# Patient Record
Sex: Male | Born: 1955 | Race: Black or African American | Hispanic: No | Marital: Married | Smoking: Never smoker
Health system: Southern US, Community
[De-identification: ages and names within clinical notes are randomized; demographics above are authoritative.]

## PROBLEM LIST (undated history)

## (undated) DIAGNOSIS — K219 Gastro-esophageal reflux disease without esophagitis: Secondary | ICD-10-CM

## (undated) DIAGNOSIS — M1990 Unspecified osteoarthritis, unspecified site: Secondary | ICD-10-CM

## (undated) DIAGNOSIS — I10 Essential (primary) hypertension: Secondary | ICD-10-CM

## (undated) DIAGNOSIS — M109 Gout, unspecified: Secondary | ICD-10-CM

## (undated) DIAGNOSIS — N189 Chronic kidney disease, unspecified: Secondary | ICD-10-CM

## (undated) HISTORY — DX: Essential (primary) hypertension: I10

## (undated) HISTORY — PX: REPLACEMENT TOTAL KNEE: SUR1224

## (undated) HISTORY — DX: Gout, unspecified: M10.9

## (undated) HISTORY — PX: HERNIA REPAIR: SHX51

## (undated) HISTORY — DX: Unspecified osteoarthritis, unspecified site: M19.90

---

## 1999-01-09 ENCOUNTER — Emergency Department (HOSPITAL_COMMUNITY): Admission: EM | Admit: 1999-01-09 | Discharge: 1999-01-09 | Payer: Self-pay | Admitting: Emergency Medicine

## 1999-01-09 ENCOUNTER — Encounter: Payer: Self-pay | Admitting: Emergency Medicine

## 2001-10-08 ENCOUNTER — Emergency Department (HOSPITAL_COMMUNITY): Admission: EM | Admit: 2001-10-08 | Discharge: 2001-10-09 | Payer: Self-pay | Admitting: Emergency Medicine

## 2001-10-09 ENCOUNTER — Encounter: Payer: Self-pay | Admitting: Emergency Medicine

## 2004-08-17 ENCOUNTER — Emergency Department (HOSPITAL_COMMUNITY): Admission: EM | Admit: 2004-08-17 | Discharge: 2004-08-17 | Payer: Self-pay | Admitting: Emergency Medicine

## 2004-08-21 ENCOUNTER — Emergency Department (HOSPITAL_COMMUNITY): Admission: EM | Admit: 2004-08-21 | Discharge: 2004-08-21 | Payer: Self-pay | Admitting: Emergency Medicine

## 2004-09-08 ENCOUNTER — Emergency Department (HOSPITAL_COMMUNITY): Admission: EM | Admit: 2004-09-08 | Discharge: 2004-09-09 | Payer: Self-pay | Admitting: Emergency Medicine

## 2005-08-02 ENCOUNTER — Emergency Department (HOSPITAL_COMMUNITY): Admission: EM | Admit: 2005-08-02 | Discharge: 2005-08-02 | Payer: Self-pay | Admitting: Emergency Medicine

## 2006-08-20 ENCOUNTER — Other Ambulatory Visit: Payer: Self-pay

## 2006-08-21 ENCOUNTER — Observation Stay: Payer: Self-pay | Admitting: Internal Medicine

## 2006-08-31 ENCOUNTER — Encounter (INDEPENDENT_AMBULATORY_CARE_PROVIDER_SITE_OTHER): Payer: Self-pay | Admitting: Internal Medicine

## 2006-08-31 ENCOUNTER — Ambulatory Visit (HOSPITAL_COMMUNITY): Admission: RE | Admit: 2006-08-31 | Discharge: 2006-08-31 | Payer: Self-pay | Admitting: Internal Medicine

## 2006-08-31 ENCOUNTER — Ambulatory Visit: Payer: Self-pay | Admitting: Vascular Surgery

## 2008-09-27 ENCOUNTER — Emergency Department (HOSPITAL_COMMUNITY): Admission: EM | Admit: 2008-09-27 | Discharge: 2008-09-27 | Payer: Self-pay | Admitting: Emergency Medicine

## 2008-10-28 ENCOUNTER — Encounter: Admission: RE | Admit: 2008-10-28 | Discharge: 2008-10-28 | Payer: Self-pay | Admitting: Orthopaedic Surgery

## 2009-10-16 ENCOUNTER — Emergency Department (HOSPITAL_COMMUNITY): Admission: EM | Admit: 2009-10-16 | Discharge: 2009-10-16 | Payer: Self-pay | Admitting: Family Medicine

## 2010-08-18 ENCOUNTER — Ambulatory Visit (HOSPITAL_COMMUNITY)
Admission: RE | Admit: 2010-08-18 | Discharge: 2010-08-18 | Disposition: A | Discharge status: Home / Self Care | Payer: BC Managed Care – PPO | Source: Ambulatory Visit | Attending: Orthopaedic Surgery | Admitting: Orthopaedic Surgery

## 2010-08-18 ENCOUNTER — Other Ambulatory Visit (HOSPITAL_COMMUNITY): Payer: Self-pay | Admitting: Orthopaedic Surgery

## 2010-08-18 ENCOUNTER — Encounter (HOSPITAL_COMMUNITY)
Admission: RE | Admit: 2010-08-18 | Discharge: 2010-08-18 | Disposition: A | Discharge status: Home / Self Care | Payer: BC Managed Care – PPO | Source: Ambulatory Visit | Attending: Orthopaedic Surgery | Admitting: Orthopaedic Surgery

## 2010-08-18 DIAGNOSIS — Z01811 Encounter for preprocedural respiratory examination: Secondary | ICD-10-CM | POA: Insufficient documentation

## 2010-08-18 DIAGNOSIS — M1710 Unilateral primary osteoarthritis, unspecified knee: Secondary | ICD-10-CM | POA: Insufficient documentation

## 2010-08-18 DIAGNOSIS — Z01810 Encounter for preprocedural cardiovascular examination: Secondary | ICD-10-CM | POA: Insufficient documentation

## 2010-08-18 DIAGNOSIS — I10 Essential (primary) hypertension: Secondary | ICD-10-CM | POA: Insufficient documentation

## 2010-08-18 DIAGNOSIS — Z01812 Encounter for preprocedural laboratory examination: Secondary | ICD-10-CM | POA: Insufficient documentation

## 2010-08-18 DIAGNOSIS — IMO0002 Reserved for concepts with insufficient information to code with codable children: Secondary | ICD-10-CM | POA: Insufficient documentation

## 2010-08-18 DIAGNOSIS — M1711 Unilateral primary osteoarthritis, right knee: Secondary | ICD-10-CM

## 2010-08-18 LAB — URINALYSIS, ROUTINE W REFLEX MICROSCOPIC
Bilirubin Urine: NEGATIVE
Ketones, ur: NEGATIVE mg/dL
Leukocytes, UA: NEGATIVE
Nitrite: NEGATIVE
Protein, ur: NEGATIVE mg/dL
Urobilinogen, UA: 0.2 mg/dL (ref 0.0–1.0)
pH: 6 (ref 5.0–8.0)

## 2010-08-18 LAB — COMPREHENSIVE METABOLIC PANEL
ALT: 12 U/L (ref 0–53)
AST: 17 U/L (ref 0–37)
Albumin: 3.8 g/dL (ref 3.5–5.2)
Alkaline Phosphatase: 145 U/L — ABNORMAL HIGH (ref 39–117)
BUN: 9 mg/dL (ref 6–23)
Chloride: 103 mEq/L (ref 96–112)
GFR calc Af Amer: >60 mL/min (ref >60)
Potassium: 3.9 mEq/L (ref 3.5–5.1)
Sodium: 141 mEq/L (ref 135–145)
Total Bilirubin: 0.6 mg/dL (ref 0.3–1.2)
Total Protein: 7.6 g/dL (ref 6.0–8.3)

## 2010-08-18 LAB — CBC
MCH: 27.9 pg (ref 26.0–34.0)
MCV: 78.8 fL (ref 78.0–100.0)
Platelets: 262 10*3/uL (ref 150–400)
RBC: 5.2 MIL/uL (ref 4.22–5.81)
RDW: 15.1 % (ref 11.5–15.5)
WBC: 6.5 10*3/uL (ref 4.0–10.5)

## 2010-08-18 LAB — PROTIME-INR: INR: 1.16 (ref 0.00–1.49)

## 2010-08-18 LAB — APTT: aPTT: 30 seconds (ref 24–37)

## 2010-08-18 LAB — SURGICAL PCR SCREEN: Staphylococcus aureus: NEGATIVE

## 2010-08-22 ENCOUNTER — Inpatient Hospital Stay (HOSPITAL_COMMUNITY): Payer: BC Managed Care – PPO

## 2010-08-22 ENCOUNTER — Inpatient Hospital Stay (HOSPITAL_COMMUNITY)
Admission: RE | Admit: 2010-08-22 | Discharge: 2010-08-25 | DRG: 209 | Disposition: A | Discharge status: Home Health | Payer: BC Managed Care – PPO | Source: Ambulatory Visit | Attending: Orthopaedic Surgery | Admitting: Orthopaedic Surgery

## 2010-08-22 DIAGNOSIS — M1710 Unilateral primary osteoarthritis, unspecified knee: Principal | ICD-10-CM | POA: Diagnosis present

## 2010-08-22 DIAGNOSIS — Z7901 Long term (current) use of anticoagulants: Secondary | ICD-10-CM

## 2010-08-22 DIAGNOSIS — I10 Essential (primary) hypertension: Secondary | ICD-10-CM | POA: Diagnosis present

## 2010-08-22 DIAGNOSIS — M109 Gout, unspecified: Secondary | ICD-10-CM | POA: Diagnosis present

## 2010-08-22 LAB — ABO/RH: ABO/RH(D): B POS

## 2010-08-22 LAB — TYPE AND SCREEN: Antibody Screen: NEGATIVE

## 2010-08-23 LAB — BASIC METABOLIC PANEL
Calcium: 8.5 mg/dL (ref 8.4–10.5)
Chloride: 104 mEq/L (ref 96–112)
Creatinine, Ser: 1.19 mg/dL (ref 0.4–1.5)
GFR calc Af Amer: >60 mL/min (ref >60)
Sodium: 140 mEq/L (ref 135–145)

## 2010-08-23 LAB — PROTIME-INR
INR: 1.26 (ref 0.00–1.49)
Prothrombin Time: 16 seconds — ABNORMAL HIGH (ref 11.6–15.2)

## 2010-08-23 LAB — CBC
MCH: 26.9 pg (ref 26.0–34.0)
Platelets: 243 10*3/uL (ref 150–400)
RBC: 4.24 MIL/uL (ref 4.22–5.81)

## 2010-08-24 LAB — CBC
Hemoglobin: 11.3 g/dL — ABNORMAL LOW (ref 13.0–17.0)
MCHC: 33.6 g/dL (ref 30.0–36.0)
RDW: 15.6 % — ABNORMAL HIGH (ref 11.5–15.5)
WBC: 7.3 10*3/uL (ref 4.0–10.5)

## 2010-08-24 LAB — PROTIME-INR
INR: 1.75 — ABNORMAL HIGH (ref 0.00–1.49)
Prothrombin Time: 20.6 seconds — ABNORMAL HIGH (ref 11.6–15.2)

## 2010-09-26 NOTE — Op Note (Signed)
NAME:  Angel [name], Angel [name]                ACCOUNT NO.:  [id number]  MEDICAL RECORD NO.:  [id number]  LOCATION:  5012                         FACILITY:  [facility]  PHYSICIAN:  Juan Olthoff C. Ophelia Charter, M.D.    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  PROCEDURE:  Right cemented total knee arthroplasty, computer assist.  SURGEON:  Jawara Latorre C. Ophelia Charter, MD  ASSISTANT:  Wende Neighbors, PA-C, medically necessary and present for entire procedure.  ESTIMATED BLOOD LOSS:  Minimal.  TOURNIQUET TIME:  One hour and 20 minutes.  DRAINS:  None.  ANESTHESIA:  GOT plus preoperative femoral nerve block, Marcaine skin local.  PROCEDURE:  After induction of general anesthesia, preoperative antibiotics, proximal thigh tourniquet, heel bump, lateral post, Foley catheter was inserted and then standard prepping and draping with DuraPrep, usual impervious stockinette, Coban, split sheets, drapes, sterile skin marker, Betadine, Steri-Drape on the skin was applied. Time-out procedure was completed.  Anterior incision was made after wrapping leg with Esmarch, tourniquet inflation at 350 pressure. 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  Deep retinaculum was opened.  There was some calcified pieces of bone in the retinaculum which were excised and grade 4 changes on the medial femoral condyle with large marginal osteophytes and extra-articular erosive changes consistent with his gout.  Bone-on-bone changes in all three compartments.  Computer pins were inserted.  Meniscus were resected which were extremely hard.  Knife blades had to be changed several times where some spot  areas of calcification in the meniscus medially.  Stab incision in the tibia spreading with tonsil clamp and then bicortical pin placement.  Femoral and tibial models were generated, 9 mm was taken off the femur for size 4.  Cuts were verified.  Chamfer cuts and box cuts were made.  On the tibial side, #4 was appropriate.  There was extremely hard sclerotic bone in the medial compartment.  Spurs removed at the posterior aspect of the femur, primarily larger medial with three- quarter curved osteotome.  Patella was prepared resecting facet-to-facet and 41 patella was appropriate size.  Trials were inserted.  This suggested that there was still tightness medial with flexion/extension, slight mismatch and medial collateral ligament was stripped with three- quarter curved osteotome partially off of the tibia releasing the deep fibers, continued work until there was a millimeter of difference. Medial side actually felt symmetrical with varus valgus stress in both the 9 degrees of flexion, which was perfectly balanced with the same 23.5-mm gap as with the extension.  Computer showed that knee reached 1 degree to 1.5 degrees hyperextension.  Trials were removed.  Cement was vacuum mixed, femoral component is opened.  Tibia was cemented first followed by cementing the femur, placement of the all-poly rotating platform DePuy. Patella with femur was a DePuy #4 with lugs and lug holes had been drilled.  A 41 all-poly 3-peg patella was  clamped on it once the cement was hard for 15 minutes.  Tourniquet was deflated. Standard closure with deep retinaculum after hemostasis obtained. Interrupted nonabsorbable sutures, Ethibond, 2-0 Vicryl in the subcutaneous tissue and 3-0 Vicryl subcuticular skin closure.  Tincture of Benzoin, Steri-Strips, 4x4s, Webril and Ace wrap followed by a knee immobilizer applied.  The patient tolerated the procedure well, transferred to recovery room in stable  condition.     Obrian Bulson C. Ophelia Charter, M.D.     MCY/MEDQ  D:  08/22/2010  T:  08/23/2010  Job:  [id number]  Electronically Signed by Annell Greening M.D. on 09/26/2010 02:21:43 PM

## 2010-10-10 NOTE — Discharge Summary (Signed)
NAME:  Angel [name], Angel [name]                ACCOUNT NO.:  [id number]  MEDICAL RECORD NO.:  [id number]  LOCATION:  5012                         FACILITY:  [facility]  PHYSICIAN:  Coriann Brouhard C. Ophelia Charter, M.D.    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  PROCEDURE:  On August 22, 2010, the patient underwent right cemented total knee arthroplasty computer-assisted performed by Dr. Ophelia Charter, assisted by Angel [name] under general anesthesia.  CONSULTATIONS:  None.  BRIEF HISTORY:  The patient is a 55 year old male with chronic and progressive pain in the right knee.  He has failed conservative treatment including intra-articular steroid injections and physical therapy.  He has radiographic findings consistent with osteoarthritis of the right knee and on exam has a varus deformity with limited range of motion.  It was felt he would require surgical intervention and was admitted for the procedure as stated above.  BRIEF [name] COURSE:  The patient tolerated the procedure under general anesthesia.  Postoperatively, neurovascular motor function of the lower extremities was noted to be intact.  Dressing changes were done daily and his wound was healing without erythema or drainage.  The patient was started on Coumadin for DVT prophylaxis.  Adjustments in Coumadin dose were made according to daily pro times by the pharmacist. The patient had significant history of gout which had been treated routinely with Naprosyn for a number of years.  The Naprosyn had to be discontinued secondary to use of Coumadin for DVT prophylaxis. Colchicine was utilized during the [name] stay for his gouty arthritis.  The patient was able to void  without difficulty once his Foley catheter was discontinued.  He was able to take a regular diet. The patient was seen by the physical therapist and was ambulating in the hallway utilizing a walker and weightbearing as tolerated on the operative extremity.  Knee immobilizer was utilized for ambulation.  The patient was able to flex to as much as 65 degrees and ambulate as much as 300 feet.  He did have some difficulty reaching full extension and was instructed to use a knee immobilizer several times daily to help with getting the knee into full extension.  The patient's hemoglobin and hematocrit were stable postoperatively at 11.4 and 33.5.  Remaining laboratory values were within normal limits.  INR at discharge 1.82.  On August 25, 2010, he was stable for discharge to his home.  PLAN:  Arrangements were made for home health physical therapy, durable medical equipment, and Coumadin management.  He was instructed to change his dressing daily or as needed to the knee.  He will wear his knee immobilizer several times daily to help with extension and will use it when ambulatory.  Otherwise he should be out of the knee immobilizer for range of motion exercises.  He will use ice to the knee as needed.  He will follow up with Dr. Ophelia Charter 2 weeks from the date of surgery.  The patient will continue to use a walker for ambulation and weightbearing as tolerated on the operative extremity.  MEDICATIONS AT DISCHARGE:  The patient was given a prescription for colchicine 0.6 mg daily.  He will continue on the colchicine while on Coumadin and once the Coumadin has been discontinued, he will restart his Naprosyn.  He was also given prescriptions for methocarbamol 500 mg 1 every 6-8 hours as needed for spasm, Percocet 5/325, 1-2 every 4-6 hours as needed for pain.  He will resume his allopurinol and Azor as taken prior to admission and was instructed to stop indomethacin, Aleve, Naprosyn, and hydrocodone.   All questions encouraged and answered.  CONDITION ON DISCHARGE:  Stable.     Wende Neighbors, P.A.   ______________________________ Veverly Fells Ophelia Charter, M.D.    SMV/MEDQ  D:  10/06/2010  T:  10/06/2010  Job:  [id number]  cc:   Loraine Leriche C. Ophelia Charter, M.D.  Electronically Signed by Dorna Mai. on 10/10/2010 10:06:41 AM Electronically Signed by Annell Greening M.D. on 10/10/2010 04:11:16 PM

## 2011-06-18 ENCOUNTER — Ambulatory Visit (INDEPENDENT_AMBULATORY_CARE_PROVIDER_SITE_OTHER): Payer: BC Managed Care – PPO | Admitting: Internal Medicine

## 2011-06-18 ENCOUNTER — Ambulatory Visit: Payer: BC Managed Care – PPO

## 2011-06-18 VITALS — BP 200/101 | HR 80 | Temp 98.5°F | Resp 16 | Ht 67.75 in | Wt 221.0 lb

## 2011-06-18 DIAGNOSIS — IMO0002 Reserved for concepts with insufficient information to code with codable children: Secondary | ICD-10-CM

## 2011-06-18 DIAGNOSIS — I10 Essential (primary) hypertension: Secondary | ICD-10-CM

## 2011-06-18 DIAGNOSIS — M25519 Pain in unspecified shoulder: Secondary | ICD-10-CM

## 2011-06-18 DIAGNOSIS — S43429A Sprain of unspecified rotator cuff capsule, initial encounter: Secondary | ICD-10-CM

## 2011-06-18 MED ORDER — LISINOPRIL-HYDROCHLOROTHIAZIDE 10-12.5 MG PO TABS: 1.0000 | ORAL_TABLET | Freq: Every day | ORAL | Status: DC

## 2011-06-18 MED ORDER — HYDROCODONE-ACETAMINOPHEN 7.5-750 MG PO TABS: 1.0000 | ORAL_TABLET | Freq: Every day | ORAL | Status: AC

## 2011-06-18 MED ORDER — MELOXICAM 15 MG PO TABS: 15.0000 mg | ORAL_TABLET | Freq: Every day | ORAL | Status: AC

## 2011-06-18 NOTE — Progress Notes (Signed)
Addended byCaffie Damme on: 06/18/2011 03:41 PM   Modules accepted: Orders

## 2011-06-18 NOTE — Patient Instructions (Signed)
Follow the exercise handout If not well in 3 weeks then we will arrange orthopedic consult if you'll call Arrange a followup appointment with Dr. Eula Listen in the next 4-8 weeks for your blood pressure

## 2011-06-18 NOTE — Progress Notes (Signed)
  Subjective:    Patient ID: Angel [name], male    DOB: 1955-07-01, 56 y.o.   MRN: [id number]  HPIWorks hauling cars on large trailer Was pulling on the chain bar when it slipped and he had a sudden sharp pain in his right shoulder-2 weeks ago Since then he has had persistent nocturnal pain which wakes him and he is been unable to lift his right shoulder above 90 He has pain with use in his right arm with these at shoulder height/trouble putting on his shirt/okay brushing teeth No prior injury to his shoulder   He has a history of being on blood pressure medicine and a brother with hypertension but he stopped taking his medicine and stopped going for followup a few years ago when his insurance changed. He is to restart primary care with Dr. Eula Listen soon    Review of SystemsNo current chest pain palpitations dyspnea on exertion fatigue or change in exercise tolerance/Nonsmoker/history of gout but stable      Objective:   Physical ExamVital signs blood pressure 200/101 Obese HEENT is clear Heart is regular without murmur  The right shoulder is not swollen/clavicle nontender/there is pain in the bicipital groove and over the anterior shoulder to palpation/anterior elevation to 90 is intact/adduction full Abduction to 90 his good but abduction against resistance is painful pull was intact but pushes painful/empty can negative There is no peripheral weakness and numbness on the right side      [facility] reading (PRIMARY) by  Dr.Jaion Lagrange=No fracture or dislocation   Assessment & Plan:  Problem #1 shoulder pain secondary to rotator cuff/anterior injury  Mobic 15 mg 1 daily #30 No use at work Vicodin ES at bedtime Exercises for rotator cuff injury If not responding in 2-3 weeks needs orthopedic evaluation  Problem #2 probable hypertension Lisinopril 10/12.5 HCT To be started while waiting for his appointment with Dr. Eula Listen  Problem #3 elevated BMI

## 2011-08-28 ENCOUNTER — Ambulatory Visit: Payer: BC Managed Care – PPO | Admitting: Emergency Medicine

## 2011-08-28 VITALS — BP 143/90 | HR 74 | Temp 98.1°F | Resp 16 | Ht 68.0 in | Wt 213.4 lb

## 2011-08-28 DIAGNOSIS — Z0289 Encounter for other administrative examinations: Secondary | ICD-10-CM

## 2011-08-28 DIAGNOSIS — I10 Essential (primary) hypertension: Secondary | ICD-10-CM

## 2011-08-28 DIAGNOSIS — Z0000 Encounter for general adult medical examination without abnormal findings: Secondary | ICD-10-CM

## 2011-08-28 NOTE — Progress Notes (Signed)
  Subjective:    Patient ID: Angel [name], male    DOB: [birth year], 56 y.o.   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    Review of Systems he has a history of hypertension on lisinopril. He also has a history of a right total knee replacement for which he sees Dr. Jorge Mandril. Dr. Jorge Mandril is going to be doing all of his routine family medicine care     Objective:   Physical Exam  Constitutional: He is oriented to person, place, and time. He appears well-developed and well-nourished.  HENT:  Head: Normocephalic.  Eyes: Pupils are equal, round, and reactive to light.  Neck: No tracheal deviation present. No thyromegaly present.  Cardiovascular: Normal rate, regular rhythm and normal heart sounds.   Pulmonary/Chest: Effort normal and breath sounds normal.  Abdominal: Soft. He exhibits no distension and no mass. There is no tenderness. There is no rebound and no guarding.  Genitourinary:       Patient declines a rectal exam stating that he is going to have a prostate exam by Dr. Jorge Mandril .  Musculoskeletal: Normal range of motion.       There is a scar over the right knee with good range of motion.  Neurological: He is oriented to person, place, and time.    ua 1+ blood      Assessment & Plan:  Patient qualifies for one year DOT card. He will follow up with Dr. Jorge Mandril regarding prostate exams and scheduling of colonoscopy. In the interim he will continue his lisinopril. Patient also advised a small amount of blood in his urine he will also follow this up with Dr. Jorge Mandril

## 2011-09-20 ENCOUNTER — Other Ambulatory Visit: Payer: Self-pay | Admitting: Family Medicine

## 2011-09-20 ENCOUNTER — Ambulatory Visit
Admission: RE | Admit: 2011-09-20 | Discharge: 2011-09-20 | Disposition: A | Discharge status: Home / Self Care | Payer: BC Managed Care – PPO | Source: Ambulatory Visit | Attending: Family Medicine | Admitting: Family Medicine

## 2011-09-20 DIAGNOSIS — R042 Hemoptysis: Secondary | ICD-10-CM

## 2011-12-06 ENCOUNTER — Other Ambulatory Visit: Payer: Self-pay | Admitting: Nephrology

## 2011-12-13 ENCOUNTER — Ambulatory Visit
Admission: RE | Admit: 2011-12-13 | Discharge: 2011-12-13 | Disposition: A | Discharge status: Home / Self Care | Payer: BC Managed Care – PPO | Source: Ambulatory Visit | Attending: Nephrology | Admitting: Nephrology

## 2012-08-11 ENCOUNTER — Ambulatory Visit: Payer: Self-pay | Admitting: Family Medicine

## 2012-08-11 VITALS — BP 130/85 | HR 56 | Temp 97.9°F | Resp 16 | Ht 69.0 in | Wt 229.2 lb

## 2012-08-11 DIAGNOSIS — Z0000 Encounter for general adult medical examination without abnormal findings: Secondary | ICD-10-CM

## 2012-08-11 DIAGNOSIS — Z0289 Encounter for other administrative examinations: Secondary | ICD-10-CM

## 2012-08-11 NOTE — Progress Notes (Signed)
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 He served in [place] and has a large scar in the right temporal region to prove it. He's here for DOT physical. He takes blood pressure medicine but he cannot remember the name.  Objective: Please see DOT exam form which has been filled out and found to be good for one year.

## 2012-08-17 ENCOUNTER — Ambulatory Visit (INDEPENDENT_AMBULATORY_CARE_PROVIDER_SITE_OTHER): Payer: BC Managed Care – PPO | Admitting: Family Medicine

## 2012-08-17 ENCOUNTER — Ambulatory Visit: Payer: BC Managed Care – PPO

## 2012-08-17 VITALS — BP 130/84 | HR 65 | Temp 97.9°F | Resp 18 | Ht 68.0 in | Wt 227.0 lb

## 2012-08-17 DIAGNOSIS — M25531 Pain in right wrist: Secondary | ICD-10-CM

## 2012-08-17 DIAGNOSIS — M109 Gout, unspecified: Secondary | ICD-10-CM

## 2012-08-17 DIAGNOSIS — M25539 Pain in unspecified wrist: Secondary | ICD-10-CM

## 2012-08-17 LAB — POCT CBC
Granulocyte percent: 79.7 %G (ref 37–80)
Hemoglobin: 13.2 g/dL — AB (ref 14.1–18.1)
MCH, POC: 30 pg (ref 27–31.2)
MID (cbc): 0.4 (ref 0–0.9)
MPV: 10.3 fL (ref 0–99.8)
POC MID %: 6.1 %M (ref 0–12)
Platelet Count, POC: 158 10*3/uL (ref 142–424)
RBC: 4.4 M/uL — AB (ref 4.69–6.13)
WBC: 6.2 10*3/uL (ref 4.6–10.2)

## 2012-08-17 MED ORDER — PREDNISONE 20 MG PO TABS: ORAL_TABLET | ORAL | Status: DC

## 2012-08-17 MED ORDER — ALLOPURINOL 300 MG PO TABS: 300.0000 mg | ORAL_TABLET | Freq: Every day | ORAL | Status: AC

## 2012-08-17 NOTE — Progress Notes (Signed)
[address]   [place], [place]  [address]   [phone]  Subjective:    Patient ID: Angel [name], male    DOB: December 26, 1955, 57 y.o.   MRN: [id number]  HPI This 57 y.o. male presents for evaluation of R wrist pain.  Onset yesterday.  History of Allopurinol but ran out two weeks ago.  History of gout in ankles and wrist.  No gout in two years since taking Allopurinol.  Feels like gout.  No injury. Pain mostly radial aspect.  +swelling.  Warm to touch.  Pain with movement.  No fever/chills/sweats.  Took Oxycodone this morning on empty stomach; took Ibuprofen and Aleve.  Truck driver.  Icing and heat.   PCP:  [facility].   Review of Systems  Constitutional: Negative for fever, chills, diaphoresis and fatigue.  Musculoskeletal: Positive for myalgias, joint swelling and arthralgias.  Skin: Negative for color change, pallor, rash and wound.  Neurological: Negative for weakness and numbness.    Past Medical History  Diagnosis Date  . Hypertension   . Gout   . Arthritis     knees    Past Surgical History  Procedure Laterality Date  . Replacement total knee      Prior to Admission medications   Medication Sig Start Date End Date Taking? Authorizing Provider  allopurinol (ZYLOPRIM) 100 MG tablet Take 100 mg by mouth daily.    Historical Provider, MD  lisinopril-hydrochlorothiazide (PRINZIDE,ZESTORETIC) 10-12.5 MG per tablet Take 1 tablet by mouth daily. 06/18/11 06/17/12  Tonye Pearson, MD    No Known Allergies  History   Social History  . Marital Status: Single    Spouse Name: N/A    Number of Children: N/A  . Years of Education: N/A   Occupational History  .      Truck driver   Social History Main Topics  . Smoking status: Never Smoker   . Smokeless tobacco: Not on file  . Alcohol Use: Yes  . Drug Use: No  . Sexually Active: Not on file   Other Topics Concern  . Not on file   Social History Narrative  . No narrative on file    History  reviewed. No pertinent family history.     Objective:   Physical Exam  Nursing note and vitals reviewed. Constitutional: He is oriented to person, place, and time. He appears well-developed and well-nourished. No distress.  Cardiovascular:  Pulses:      Radial pulses are 2+ on the right side, and 2+ on the left side.  Capillary refill < 3 seconds.  Musculoskeletal:       Right wrist: He exhibits decreased range of motion, tenderness, bony tenderness and swelling.  R wrist:  +TTP radial aspect of wrist with localized swelling; pain with flexion, extension, supination, pronation. +warmth at area of swelling.  Scant redness. No streaking.   R hand:  No swelling; non-tender; grip 5/5.  Neurological: He is alert and oriented to person, place, and time.  Skin: Skin is warm and dry. No rash noted. He is not diaphoretic. There is erythema.  Scant erythema along R radial aspect of wrist.  Psychiatric: He has a normal mood and affect. His behavior is normal.   Results for orders placed in visit on 08/17/12  POCT CBC      Result Value Range   WBC 6.2  4.6 - 10.2 K/uL   Lymph, poc 0.9  0.6 - 3.4   POC LYMPH PERCENT 14.2  10 - 50 %L   MID (cbc) 0.4  0 - 0.9   POC MID % 6.1  0 - 12 %M   POC Granulocyte 4.9  2 - 6.9   Granulocyte percent 79.7  37 - 80 %G   RBC 4.40 (*) 4.69 - 6.13 M/uL   Hemoglobin 13.2 (*) 14.1 - 18.1 g/dL   HCT, POC 40.9 (*) 81.1 - 53.7 %   MCV 95.6  80 - 97 fL   MCH, POC 30.0  27 - 31.2 pg   MCHC 31.4 (*) 31.8 - 35.4 g/dL   RDW, POC 91.4     Platelet Count, POC 158  142 - 424 K/uL   MPV 10.3  0 - 99.8 fL   [facility] reading (PRIMARY) by  Dr. Katrinka Blazing.  R WRIST: NAD.      Assessment & Plan:  Wrist pain, right - Plan: DG Wrist Complete Right, POCT CBC, Uric acid  Gouty Attack  1. R wrist pain/swelling:  New.  Secondary to gouty attack; no trauma.  Continue oxycodone qid PRN for pain.  Rest, elevate. 2.  Gouty Attack:  New.  R wrist.  Rx for Prednisone provided; continue  Oxycodone for pain. Start Allopurinol in upcoming 3-5 days while still on Prednisone taper.  Meds ordered this encounter  Medications  . predniSONE (DELTASONE) 20 MG tablet    Sig: Three tablets daily x 2 days then two tablets daily x 5 days then one tablet daily x 5 days    Dispense:  21 tablet    Refill:  0  . allopurinol (ZYLOPRIM) 300 MG tablet    Sig: Take 1 tablet (300 mg total) by mouth daily.    Dispense:  30 tablet    Refill:  5

## 2012-09-13 ENCOUNTER — Telehealth: Payer: Self-pay

## 2012-09-13 NOTE — Telephone Encounter (Signed)
Pt calling back regarding labs. BEST# [phone]

## 2012-09-13 NOTE — Telephone Encounter (Signed)
Call --- 1.Uric acid level slightly elevated to suggest that wrist pain due to gouty attack. How is wrist pain and swelling? We were unable to reach, letter was sent. Called again. This number belongs to [employer], did not leave message.

## 2012-09-13 NOTE — Telephone Encounter (Signed)
Patient's wife advised

## 2012-09-13 NOTE — Telephone Encounter (Signed)
PT is trying to get intouch with someone in the lab about some results.  Call back number is [phone]

## 2012-10-03 ENCOUNTER — Encounter: Payer: Self-pay | Admitting: Family Medicine

## 2013-08-17 ENCOUNTER — Ambulatory Visit: Payer: Self-pay | Admitting: Family Medicine

## 2013-08-17 VITALS — BP 130/90 | HR 60 | Temp 98.1°F | Resp 16 | Ht 69.0 in | Wt 228.0 lb

## 2013-08-17 DIAGNOSIS — I10 Essential (primary) hypertension: Secondary | ICD-10-CM

## 2013-08-17 DIAGNOSIS — Z0289 Encounter for other administrative examinations: Secondary | ICD-10-CM

## 2013-08-17 MED ORDER — LISINOPRIL-HYDROCHLOROTHIAZIDE 10-12.5 MG PO TABS: 1.0000 | ORAL_TABLET | Freq: Every day | ORAL | Status: DC

## 2013-08-17 NOTE — Progress Notes (Signed)
U/A  Spgr- 1.005  Protein- Neg Blood- Neg Glucose- Neg

## 2013-08-17 NOTE — Progress Notes (Signed)
Chief Complaint:  Chief Complaint  Patient presents with  . Employment Physical    DOT    HPI: Angel [name] is a 58 y.o. male who is here for DOT PE He is doing well. He denies MI, DM, OSA No CP/SOB He is doing well otherwise  Past Medical History  Diagnosis Date  . Hypertension   . Gout   . Arthritis     knees   Past Surgical History  Procedure Laterality Date  . Replacement total knee     History   Social History  . Marital Status: Single    Spouse Name: N/A    Number of Children: N/A  . Years of Education: N/A   Occupational History  .      Truck driver   Social History Main Topics  . Smoking status: Never Smoker   . Smokeless tobacco: None  . Alcohol Use: Yes  . Drug Use: No  . Sexual Activity: None   Other Topics Concern  . None   Social History Narrative  . None   History reviewed. No pertinent family history. No Known Allergies Prior to Admission medications   Medication Sig Start Date End Date Taking? Authorizing Provider  aspirin 81 MG tablet Take 81 mg by mouth daily.   Yes Historical Provider, MD  allopurinol (ZYLOPRIM) 300 MG tablet Take 1 tablet (300 mg total) by mouth daily. 08/17/12   Ethelda Chick, MD  lisinopril-hydrochlorothiazide (PRINZIDE,ZESTORETIC) 10-12.5 MG per tablet Take 1 tablet by mouth daily. 06/18/11 06/17/12  Tonye Pearson, MD     ROS: The patient denies fevers, chills, night sweats, unintentional weight loss, chest pain, palpitations, wheezing, dyspnea on exertion, nausea, vomiting, abdominal pain, dysuria, hematuria, melena, numbness, weakness, or tingling.   All other systems have been reviewed and were otherwise negative with the exception of those mentioned in the HPI and as above.    PHYSICAL EXAM: Filed Vitals:   08/17/13 0819  BP: 130/90  Pulse: 60  Temp: 98.1 F (36.7 C)  Resp: 16   Filed Vitals:   08/17/13 0819  Height: 5\' 9"  (1.753 m)  Weight: 228 lb (103.42 kg)   Body mass index is  33.65 kg/(m^2).  General: Alert, no acute distress HEENT:  Normocephalic, atraumatic, oropharynx patent. EOMI, PERRLA, fundo exam nl. Tm nl Cardiovascular:  Regular rate and rhythm, no rubs murmurs or gallops.  No Carotid bruits, radial pulse intact. No pedal edema.  Respiratory: Clear to auscultation bilaterally.  No wheezes, rales, or rhonchi.  No cyanosis, no use of accessory musculature GI: No organomegaly, abdomen is soft and non-tender, positive bowel sounds.  No masses. Skin: No rashes. Neurologic: Facial musculature symmetric. Psychiatric: Patient is appropriate throughout our interaction. Lymphatic: No cervical lymphadenopathy Musculoskeletal: Gait intact. 55 str, 2/2 dtrs, sensation intact GU -neg hernia Full ROM neck, UE and Katalia Choma   LABS: Results for orders placed in visit on 08/17/12  URIC ACID      Result Value Ref Range   Uric Acid, Serum 7.2  4.0 - 7.8 mg/dL  POCT CBC      Result Value Ref Range   WBC 6.2  4.6 - 10.2 K/uL   Lymph, poc 0.9  0.6 - 3.4   POC LYMPH PERCENT 14.2  10 - 50 %L   MID (cbc) 0.4  0 - 0.9   POC MID % 6.1  0 - 12 %M   POC Granulocyte 4.9  2 - 6.9  Granulocyte percent 79.7  37 - 80 %G   RBC 4.40 (*) 4.69 - 6.13 M/uL   Hemoglobin 13.2 (*) 14.1 - 18.1 g/dL   HCT, POC 40.942.1 (*) 81.143.5 - 53.7 %   MCV 95.6  80 - 97 fL   MCH, POC 30.0  27 - 31.2 pg   MCHC 31.4 (*) 31.8 - 35.4 g/dL   RDW, POC 91.415.3     Platelet Count, POC 158  142 - 424 K/uL   MPV 10.3  0 - 99.8 fL     EKG/XRAY:   [facility]   ASSESSMENT/PLAN: Encounter Diagnoses  Name Primary?  . HTN (hypertension) Yes  . Other general medical examination for administrative purposes    1 year DOT [facility]  Will f/u in 2 weeks for BP labs since he has been off of it. I will rx 30 days until F/u     Gross sideeffects, risk and benefits, and alternatives of medications d/w patient. Patient is aware that all medications have potential  sideeffects and we are unable to predict every sideeffect or drug-drug interaction that may occur.  Lenell Antuhao P Donavyn Fecher, DO 08/17/2013 10:18 AM

## 2013-09-23 ENCOUNTER — Other Ambulatory Visit: Payer: Self-pay | Admitting: Family Medicine

## 2013-10-08 ENCOUNTER — Other Ambulatory Visit: Payer: Self-pay | Admitting: Physician Assistant

## 2014-06-04 ENCOUNTER — Emergency Department (HOSPITAL_COMMUNITY)
Admission: EM | Admit: 2014-06-04 | Discharge: 2014-06-04 | Disposition: A | Discharge status: Home / Self Care | Payer: BLUE CROSS/BLUE SHIELD | Attending: Emergency Medicine | Admitting: Emergency Medicine

## 2014-06-04 ENCOUNTER — Encounter (HOSPITAL_COMMUNITY): Payer: Self-pay | Admitting: *Deleted

## 2014-06-04 ENCOUNTER — Other Ambulatory Visit: Payer: Self-pay

## 2014-06-04 ENCOUNTER — Emergency Department (HOSPITAL_COMMUNITY): Payer: BLUE CROSS/BLUE SHIELD

## 2014-06-04 ENCOUNTER — Ambulatory Visit (INDEPENDENT_AMBULATORY_CARE_PROVIDER_SITE_OTHER): Payer: BLUE CROSS/BLUE SHIELD | Admitting: Family Medicine

## 2014-06-04 VITALS — BP 160/84 | HR 61 | Temp 98.2°F | Resp 18 | Ht 68.0 in | Wt 227.0 lb

## 2014-06-04 DIAGNOSIS — R42 Dizziness and giddiness: Secondary | ICD-10-CM

## 2014-06-04 DIAGNOSIS — R001 Bradycardia, unspecified: Secondary | ICD-10-CM | POA: Diagnosis not present

## 2014-06-04 DIAGNOSIS — M1990 Unspecified osteoarthritis, unspecified site: Secondary | ICD-10-CM | POA: Diagnosis not present

## 2014-06-04 DIAGNOSIS — I10 Essential (primary) hypertension: Secondary | ICD-10-CM

## 2014-06-04 DIAGNOSIS — R112 Nausea with vomiting, unspecified: Secondary | ICD-10-CM | POA: Diagnosis not present

## 2014-06-04 DIAGNOSIS — R519 Headache, unspecified: Secondary | ICD-10-CM

## 2014-06-04 DIAGNOSIS — M109 Gout, unspecified: Secondary | ICD-10-CM | POA: Diagnosis not present

## 2014-06-04 DIAGNOSIS — Z79899 Other long term (current) drug therapy: Secondary | ICD-10-CM | POA: Insufficient documentation

## 2014-06-04 DIAGNOSIS — Z7982 Long term (current) use of aspirin: Secondary | ICD-10-CM | POA: Diagnosis not present

## 2014-06-04 DIAGNOSIS — R51 Headache: Secondary | ICD-10-CM | POA: Diagnosis not present

## 2014-06-04 DIAGNOSIS — H81399 Other peripheral vertigo, unspecified ear: Secondary | ICD-10-CM | POA: Diagnosis not present

## 2014-06-04 LAB — BASIC METABOLIC PANEL
BUN: 16 mg/dL (ref 6–23)
CHLORIDE: 107 meq/L (ref 96–112)
CO2: 25 mEq/L (ref 19–32)
CREATININE: 1.36 mg/dL — AB (ref 0.50–1.35)
Calcium: 9.5 mg/dL (ref 8.4–10.5)
Glucose, Bld: 80 mg/dL (ref 70–99)
Potassium: 4 mEq/L (ref 3.5–5.3)
Sodium: 143 mEq/L (ref 135–145)

## 2014-06-04 LAB — POCT CBC
Granulocyte percent: 67.2 %G (ref 37–80)
HEMATOCRIT: 44.2 % (ref 43.5–53.7)
HEMOGLOBIN: 14.1 g/dL (ref 14.1–18.1)
LYMPH, POC: 1.4 (ref 0.6–3.4)
MCH: 29.6 pg (ref 27–31.2)
MCHC: 32 g/dL (ref 31.8–35.4)
MCV: 92.6 fL (ref 80–97)
MID (CBC): 0.2 (ref 0–0.9)
MPV: 8.6 fL (ref 0–99.8)
PLATELET COUNT, POC: 155 10*3/uL (ref 142–424)
POC Granulocyte: 3.3 (ref 2–6.9)
POC LYMPH PERCENT: 27.9 %L (ref 10–50)
POC MID %: 4.9 %M (ref 0–12)
RBC: 4.77 M/uL (ref 4.69–6.13)
RDW, POC: 15.5 %
WBC: 4.9 10*3/uL (ref 4.6–10.2)

## 2014-06-04 LAB — I-STAT CHEM 8, ED
BUN: 15 mg/dL (ref 6–23)
CREATININE: 1.3 mg/dL (ref 0.50–1.35)
Calcium, Ion: 1.25 mmol/L — ABNORMAL HIGH (ref 1.12–1.23)
Chloride: 105 mmol/L (ref 96–112)
Glucose, Bld: 85 mg/dL (ref 70–99)
HEMATOCRIT: 44 % (ref 39.0–52.0)
Hemoglobin: 15 g/dL (ref 13.0–17.0)
POTASSIUM: 3.9 mmol/L (ref 3.5–5.1)
Sodium: 144 mmol/L (ref 135–145)
TCO2: 23 mmol/L (ref 0–100)

## 2014-06-04 LAB — TSH: TSH: 2.322 u[IU]/mL (ref 0.350–4.500)

## 2014-06-04 LAB — GLUCOSE, POCT (MANUAL RESULT ENTRY): POC Glucose: 76 mg/dl (ref 70–99)

## 2014-06-04 MED ORDER — ONDANSETRON 4 MG PO TBDP: 4.0000 mg | ORAL_TABLET | Freq: Three times a day (TID) | ORAL | Status: DC | PRN

## 2014-06-04 MED ORDER — ONDANSETRON HCL 4 MG/2ML IJ SOLN
4.0000 mg | Freq: Once | INTRAMUSCULAR | Status: AC
  Administered 2014-06-04: 4 mg via INTRAVENOUS
  Filled 2014-06-04: qty 2

## 2014-06-04 MED ORDER — MECLIZINE HCL 12.5 MG PO TABS: 12.5000 mg | ORAL_TABLET | Freq: Three times a day (TID) | ORAL | Status: DC | PRN

## 2014-06-04 MED ORDER — MECLIZINE HCL 25 MG PO TABS
25.0000 mg | ORAL_TABLET | Freq: Once | ORAL | Status: AC
  Administered 2014-06-04: 25 mg via ORAL
  Filled 2014-06-04: qty 1

## 2014-06-04 NOTE — Patient Instructions (Signed)
Sent  By EMS.

## 2014-06-04 NOTE — Discharge Instructions (Signed)
Please call your doctor for a followup appointment within 24-48 hours. When you talk to your doctor please let them know that you were seen in the emergency department and have them acquire all of your records so that they can discuss the findings with you and formulate a treatment plan to fully care for your new and ongoing problems. ° °

## 2014-06-04 NOTE — Progress Notes (Addendum)
Subjective:    Patient ID: Angel [name], male    DOB: 1955/05/29, 59 y.o.   MRN: [id number] This chart was scribed for Meredith StaggersJeffrey Andras Grunewald, MD by Littie Deedsichard Sun, Medical Scribe. This patient was seen in Room 13 and the patient's care was started at 2:47 PM.   HPI HPI Comments: Angel [name] is a 59 y.o. male with a hx of HTN who presents to the [facility] complaining of gradual onset, intermittent dizziness that started yesterday morning when he woke up around 4:00 AM.  Dizziness: The dizziness did not wake him up from sleep; he started feeling dizzy as he was getting dressed. Patient also vomited after turning his head later that morning. He has also had intermittent mild vision blurring that started since yesterday on the way home from work as well as nausea, increased thirst and headache described as a heavy feeling. He has been drinking more water over the past 2 days. The vision blurring and dizziness are worsened when moving his head. He notes his vision is somewhat blurred currently. He thought his symptoms were improving, but he started feeling dizzy again this morning. Patient's wife notes that he did some difficulty forming thoughts yesterday, but no trouble with speech. Patient denies fever, chest pain, weakness in arms or legs, LOC, falling, speech difficulty, palpitations, chest tightness, black and tarry stools, cold symptoms, and urinary or bowel symptoms. He also denies FMHx of stroke or aneurysm, hx of ulcers, marijuana use, and new medications or supplements. He does have FMHx of DM in multiple family members. He was last tested for DM 2 years ago. Patient does admit to occasional EtOH use, but none recently.  Hypertension: Patient is followed by Dr. Donnie Ahoilley for HTN and was last seen 7 months ago; he did have blood work done at that time. He stopped taking ASA for a colonoscopy 4-5 months ago and did not restart it after the colonoscopy. Patient did not take his blood  pressure medications today due to vomiting the medication yesterday. He states his blood pressure has been fine before today. He denies hx of other cardiac problems.  Patient served in the [employer] from [phone]. He currently drives a truck for work.   Patient Active Problem List   Diagnosis Date Noted  . Hypertension 08/28/2011   Past Medical History  Diagnosis Date  . Hypertension   . Gout   . Arthritis     knees   Past Surgical History  Procedure Laterality Date  . Replacement total knee     No Known Allergies Prior to Admission medications   Medication Sig Start Date End Date Taking? Authorizing Provider  allopurinol (ZYLOPRIM) 300 MG tablet Take 1 tablet (300 mg total) by mouth daily. 08/17/12  Yes Ethelda ChickKristi M Smith, MD  atenolol (TENORMIN) 50 MG tablet Take 50 mg by mouth daily.   Yes Historical Provider, MD  losartan-hydrochlorothiazide (HYZAAR) 100-25 MG per tablet Take 1 tablet by mouth daily.   Yes Historical Provider, MD  aspirin 81 MG tablet Take 81 mg by mouth daily.    Historical Provider, MD  lisinopril-hydrochlorothiazide (PRINZIDE,ZESTORETIC) 10-12.5 MG per tablet TAKE 1 TABLET DAILY PT NEEDS OFFICE VISIT FOR FURTHER REFILLS Patient not taking: Reported on 06/04/2014 09/25/13   Morrell RiddleSarah L Weber, PA-C   History   Social History  . Marital Status: Single    Spouse Name: N/A  . Number of Children: N/A  . Years of Education: N/A   Occupational  History  .      Truck driver   Social History Main Topics  . Smoking status: Never Smoker   . Smokeless tobacco: Not on file  . Alcohol Use: Yes  . Drug Use: No  . Sexual Activity: Not on file   Other Topics Concern  . Not on file   Social History Narrative     Review of Systems  Constitutional: Negative for fever.  HENT: Negative for congestion, rhinorrhea and sneezing.   Eyes: Positive for visual disturbance.  Respiratory: Negative for chest tightness.   Cardiovascular: Negative for chest pain and palpitations.    Gastrointestinal: Positive for nausea and vomiting. Negative for diarrhea and constipation.  Endocrine: Positive for polydipsia.  Neurological: Positive for dizziness and headaches. Negative for syncope, speech difficulty and numbness.  Psychiatric/Behavioral: Negative for confusion.       Objective:   Physical Exam  Constitutional: He is oriented to person, place, and time. He appears well-developed and well-nourished. No distress.  HENT:  Head: Normocephalic and atraumatic.  Mouth/Throat: Oropharynx is clear and moist. No oropharyngeal exudate.  Temple non-tender.  Eyes: EOM are normal. Pupils are equal, round, and reactive to light. Right eye exhibits no nystagmus. Left eye exhibits no nystagmus.  No nystagmus, supine and sitting.  Neck: Neck supple.  Cardiovascular: Normal rate and regular rhythm.   Regular rhythm with occasional pause.  Pulmonary/Chest: Effort normal.  Musculoskeletal: He exhibits no edema.  Neurological: He is alert and oriented to person, place, and time. He has normal strength. No cranial nerve deficit or sensory deficit. He displays a negative Romberg sign. GCS eye subscore is 4. GCS verbal subscore is 5. GCS motor subscore is 6.  No pronator drift. Unsteady heel-to-toe. Normal finger-to-nose. Non-focal otherwise. Equal strength upper and lower extremities. Equal facial movements. No sensory or motor deficits.  Skin: Skin is warm and dry. No rash noted.  Psychiatric: He has a normal mood and affect. His behavior is normal.  Nursing note and vitals reviewed.   Filed Vitals:   06/04/14 1344  BP: 160/96  Pulse: 61  Temp: 98.2 F (36.8 C)  TempSrc: Oral  Resp: 18  Height:  (1.727 m)  Weight: 227 lb (102.967 kg)  SpO2: 97%    EKG: marked sinus bradycardia - rate 48, PR 204. No apparent ST/twave changes.  (last dose of atenolol yesterday morning).    Results for orders placed or performed in visit on 06/04/14  POCT glucose (manual entry)  Result  Value Ref Range   POC Glucose 76 70 - 99 mg/dl  POCT CBC  Result Value Ref Range   WBC 4.9 4.6 - 10.2 K/uL   Lymph, poc 1.4 0.6 - 3.4   POC LYMPH PERCENT 27.9 10 - 50 %L   MID (cbc) 0.2 0 - 0.9   POC MID % 4.9 0 - 12 %M   POC Granulocyte 3.3 2 - 6.9   Granulocyte percent 67.2 37 - 80 %G   RBC 4.77 4.69 - 6.13 M/uL   Hemoglobin 14.1 14.1 - 18.1 g/dL   HCT, POC 16.1 09.6 - 53.7 %   MCV 92.6 80 - 97 fL   MCH, POC 29.6 27 - 31.2 pg   MCHC 32.0 31.8 - 35.4 g/dL   RDW, POC 04.5 %   Platelet Count, POC 155 142 - 424 K/uL   MPV 8.6 0 - 99.8 fL       Assessment & Plan:   Angel [name] is  a 59 y.o. male Dizziness - Plan: EKG 12-Lead, POCT glucose (manual entry), POCT CBC, Basic metabolic panel, TSH  Acute nonintractable headache, unspecified headache type  Non-intractable vomiting with nausea, vomiting of unspecified type - Plan: Basic metabolic panel  Essential hypertension - Plan: EKG 12-Lead, TSH  Hx of HTN, off ASA fo past few months. Acute onset of dizziness, nausea yesterday am at 4am, then blurry vision and nausea with emesis yesterday.  persistent nausea, intermittent blurry vision and dizziness into today. Last episode of emesis earlier this morning about 9am. nonfocal neuro exam except unsteady with heel to toe.  DDx of vertigo, but no nystagmus.  Marked bradycardia on EKG, but no other acute findings. Has not taken betablocker since yesterday am. Glucose and CBC ok.   - EMS called for transport, charge nursed advised. Further eval of symptomatic bradycardia in ER.  4:07 PM IV placed 20ga L AC, NS, EMS called for transport, placed on heart monitor - sinus rhythm, rate 56. Felt some increased headache, dizziness, but no other new neurologic symptoms. O2 sat 99% BP 170/106. No additional meds provided at this time with bradycardia. EMS en route. Charge nurse at [facility] advised.   4:19 PM Report given to EMS with transfer of care.   I personally performed the services described  in this documentation, which was scribed in my presence. The recorded information has been reviewed and considered, and addended by me as needed.

## 2014-06-04 NOTE — ED Provider Notes (Signed)
CSN: [id number]     Arrival date & time 06/04/14  1657 History   First MD Initiated Contact with Patient 06/04/14 1658     Chief Complaint  Patient presents with  . Dizziness     (Consider location/radiation/quality/duration/timing/severity/associated sxs/prior Treatment) HPI Comments: The patient is a 59 year old male, he has a medical history significant for hypertension and gout, he is on several medications for his blood pressure including losartan, hydrochlorothiazide, and atenolol. He states that yesterday morning he woke up, he felt slightly lightheaded but when he got out of bed he felt vertigo, the sensation of movement, spinning, associated with vomiting. He notes that when he changes position the symptoms get worse, when he holds perfectly still it gets better. He denies any associated weakness numbness or changes in his vision or his speech, his coordination is only difficult when he is feeling a spinning feeling. He does report having a significant headache last week that seemed to resolve spontaneously, he does not have a headache at this time. He has had no medications prior to arrival, his blood pressure was measured at 160/84. He denies chest pain, shortness of breath, back pain, swelling of the legs, ringing in the ears, loss of hearing. No fevers chills or diarrhea  Patient is a 59 y.o. male presenting with dizziness. The history is provided by the patient, the spouse and the EMS personnel.  Dizziness   Past Medical History  Diagnosis Date  . Hypertension   . Gout   . Arthritis     knees   Past Surgical History  Procedure Laterality Date  . Replacement total knee     History reviewed. No pertinent family history. History  Substance Use Topics  . Smoking status: Never Smoker   . Smokeless tobacco: Not on file  . Alcohol Use: Yes    Review of Systems  Neurological: Positive for dizziness.  All other systems reviewed and are negative.     Allergies  Review  of patient's allergies indicates no known allergies.  Home Medications   Prior to Admission medications   Medication Sig Start Date End Date Taking? Authorizing Provider  allopurinol (ZYLOPRIM) 300 MG tablet Take 1 tablet (300 mg total) by mouth daily. 08/17/12   Ethelda ChickKristi M Smith, MD  aspirin 81 MG tablet Take 81 mg by mouth daily.    Historical Provider, MD  atenolol (TENORMIN) 50 MG tablet Take 50 mg by mouth daily.    Historical Provider, MD  lisinopril-hydrochlorothiazide (PRINZIDE,ZESTORETIC) 10-12.5 MG per tablet TAKE 1 TABLET DAILY PT NEEDS OFFICE VISIT FOR FURTHER REFILLS Patient not taking: Reported on 06/04/2014 09/25/13   Morrell RiddleSarah L Weber, PA-C  losartan-hydrochlorothiazide (HYZAAR) 100-25 MG per tablet Take 1 tablet by mouth daily.    Historical Provider, MD  meclizine (ANTIVERT) 12.5 MG tablet Take 1 tablet (12.5 mg total) by mouth 3 (three) times daily as needed for dizziness. 06/04/14   Eber HongBrian Hadden Steig, MD  ondansetron (ZOFRAN ODT) 4 MG disintegrating tablet Take 1 tablet (4 mg total) by mouth every 8 (eight) hours as needed for nausea. 06/04/14   Eber HongBrian Makailyn Mccormick, MD   BP 128/81 mmHg  Pulse 56  Temp(Src) 97.8 F (36.6 C) (Oral)  Resp 14  SpO2 99% Physical Exam  Constitutional: He appears well-developed and well-nourished. No distress.  HENT:  Head: Normocephalic and atraumatic.  Mouth/Throat: Oropharynx is clear and moist. No oropharyngeal exudate.  Tympanic membranes visualized and clear bilaterally  Eyes: Conjunctivae and EOM are normal. Pupils are equal, round, and reactive  to light. Right eye exhibits no discharge. Left eye exhibits no discharge. No scleral icterus.  Neck: Normal range of motion. Neck supple. No JVD present. No thyromegaly present.  Cardiovascular: Regular rhythm, normal heart sounds and intact distal pulses.  Exam reveals no gallop and no friction rub.   No murmur heard. Mild bradycardia - on BB  Pulmonary/Chest: Effort normal and breath sounds normal. No  respiratory distress. He has no wheezes. He has no rales.  Abdominal: Soft. Bowel sounds are normal. He exhibits no distension and no mass. There is no tenderness.  Musculoskeletal: Normal range of motion. He exhibits no edema or tenderness.  Lymphadenopathy:    He has no cervical adenopathy.  Neurological: He is alert. Coordination normal.  Inducible mild nystagmus with change in position, fatigues over 2 minutes with holding perfectly still, normal strength and sensation over all 4 extremities, cranial nerves III through XII are normal, coordination by finger-nose-finger and heel shin is normal.  Skin: Skin is warm and dry. No rash noted. No erythema.  Psychiatric: He has a normal mood and affect. His behavior is normal.  Nursing note and vitals reviewed.   ED Course  Procedures (including critical care time) Labs Review Labs Reviewed  I-STAT CHEM 8, ED - Abnormal; Notable for the following:    Calcium, Ion 1.25 (*)    All other components within normal limits    Imaging Review Ct Head Wo Contrast  06/04/2014   CLINICAL DATA:  Dizziness, elevated blood pressure  EXAM: CT HEAD WITHOUT CONTRAST  TECHNIQUE: Contiguous axial images were obtained from the base of the skull through the vertex without intravenous contrast.  COMPARISON:  None.  FINDINGS: There is no evidence of mass effect, midline shift or extra-axial fluid collections. There is no evidence of a space-occupying lesion or intracranial hemorrhage. There is no evidence of a cortical-based area of acute infarction.  The ventricles and sulci are appropriate for the patient's age. The basal cisterns are patent.  Visualized portions of the orbits are unremarkable. The visualized portions of the paranasal sinuses and mastoid air cells are unremarkable.  The osseous structures are unremarkable.  IMPRESSION: Normal CT of the brain without intravenous contrast.   Electronically Signed   By: Elige Ko   On: 06/04/2014 17:48    ED ECG  REPORT  I personally interpreted this EKG   Date: 06/04/2014   Rate: 49  Rhythm: sinus bradycardia  QRS Axis: normal  Intervals: normal  ST/T Wave abnormalities: normal  Conduction Disutrbances:none  Narrative Interpretation:   Old EKG Reviewed: none available   MDM   Final diagnoses:  Dizziness  Peripheral vertigo, unspecified laterality    Well-appearing, vital signs with mild hypertension, check CT scan of the head given significant headache last week though at this time appears neurologically intact and has inducible nystagmus which is likely related to peripheral vertigo.  Improved signficantly after getting medicine - no sx at rest - still reproducible with movement - VS normal, last BP was 128/81, pt cautioned to not use atenolol until he sees his PMD for BP recheck as he has ongoing mild bradycardia.   Pt in agreement.  Meds given in ED:  Medications  meclizine (ANTIVERT) tablet 25 mg (25 mg Oral Given 06/04/14 1747)  ondansetron (ZOFRAN) injection 4 mg (4 mg Intravenous Given 06/04/14 1748)    New Prescriptions   MECLIZINE (ANTIVERT) 12.5 MG TABLET    Take 1 tablet (12.5 mg total) by mouth 3 (three) times daily as  needed for dizziness.   ONDANSETRON (ZOFRAN ODT) 4 MG DISINTEGRATING TABLET    Take 1 tablet (4 mg total) by mouth every 8 (eight) hours as needed for nausea.      Eber Hong, MD 06/04/14 [phone]

## 2014-06-04 NOTE — ED Notes (Signed)
Pt c/o dizziness starting yesterday morning. Pt reports when standing after waking up, dizziness worsening. Hx of hypertension. Pt reports going to work yesterday and dizziness worsened, specifically when looking from left to right. Pt had an episode of vomiting after dizziness. Dizziness gets better if laying still without movements. Also c/o headache

## 2014-06-29 ENCOUNTER — Telehealth: Payer: Self-pay

## 2014-06-29 DIAGNOSIS — R42 Dizziness and giddiness: Secondary | ICD-10-CM

## 2014-06-29 NOTE — Telephone Encounter (Signed)
Requesting a referral to ENT  [phone]

## 2014-06-29 NOTE — Telephone Encounter (Signed)
Seen 3/17 for vertigo. Do you want to refer to ENT?

## 2014-07-01 NOTE — Telephone Encounter (Signed)
Gave wife message

## 2014-07-01 NOTE — Telephone Encounter (Signed)
We can refer to ENT but if any worsening of symptoms, should return here or emergency room.

## 2014-09-06 ENCOUNTER — Ambulatory Visit (INDEPENDENT_AMBULATORY_CARE_PROVIDER_SITE_OTHER): Payer: Self-pay | Admitting: Physician Assistant

## 2014-09-06 VITALS — BP 140/90 | HR 60 | Temp 98.1°F | Resp 16 | Ht 68.0 in | Wt 227.0 lb

## 2014-09-06 DIAGNOSIS — Z0289 Encounter for other administrative examinations: Secondary | ICD-10-CM

## 2014-09-06 NOTE — Progress Notes (Signed)
Commercial Driver Medical Examination   Angel [name] is a 59 y.o. male who presents today for a commercial driver fitness determination physical exam. The patient reports no problems. The following portions of the patient's history were reviewed and updated as appropriate: allergies, current medications, past family history, past medical history, past social history, past surgical history and problem list.  Pt treated for HTN with atenolol, losartan and lisinopril. States BP always normal at home 100-120/70-80.   Right total knee replacement in 2013. Left inguinal hernia repair at age 70. No problems since.  No history of DM, OSA, tobacco use. Uses occ alcohol.  States had one episode of vertigo over 2 months ago. Took meclizine once for this. No problems with vertigo before this episode or since. Has never had vertigo while driving but states he knows if it did occur while driving, he would pull over immediately.  Review of Systems Pertinent items are noted in HPI.   Objective:    Vision/hearing:  Visual Acuity Screening   Right eye Left eye Both eyes  Without correction: 20/25-2 20/25-2 20/20-1  With correction:     Comments: Peripheral Vision: Right eye 70 degrees. Left eye 70 degrees.The patient can distinguish the colors red, amber and green.  Hearing Screening Comments: The patient was able to hear a forced whisper from 10 feet.  Applicant can recognize and distinguish among traffic control signals and devices showing standard red, green, and amber colors.  Corrective lenses required: No  Monocular Vision?: No  Hearing aid requirement: No  Physical Exam  Constitutional: He is oriented to person, place, and time. He appears well-developed and well-nourished.  HENT:  Head: Normocephalic and atraumatic.  Right Ear: Hearing, tympanic membrane, external ear and ear canal normal.  Left Ear: Hearing, tympanic membrane, external ear and ear canal normal.  Nose: Nose  normal.  Mouth/Throat: Uvula is midline, oropharynx is clear and moist and mucous membranes are normal.  Eyes: Conjunctivae, EOM and lids are normal. Pupils are equal, round, and reactive to light. Right eye exhibits no discharge. Left eye exhibits no discharge. No scleral icterus.  Neck: Trachea normal and normal range of motion. Neck supple.  Cardiovascular: Normal rate, regular rhythm, normal heart sounds, intact distal pulses and normal pulses.   No murmur heard. Pulmonary/Chest: Effort normal and breath sounds normal. No respiratory distress. He has no wheezes. He has no rhonchi. He has no rales.  Abdominal: Soft. Normal appearance. There is no tenderness. No hernia.  Musculoskeletal: Normal range of motion. He exhibits no edema or tenderness.  Lymphadenopathy:       Head (right side): No submental, no submandibular, no tonsillar, no preauricular, no posterior auricular and no occipital adenopathy present.       Head (left side): No submental, no submandibular, no tonsillar, no preauricular, no posterior auricular and no occipital adenopathy present.    He has no cervical adenopathy.  Neurological: He is alert and oriented to person, place, and time. He has normal strength and normal reflexes. No cranial nerve deficit or sensory deficit. Coordination and gait normal.  Skin: Skin is warm, dry and intact. No lesion and no rash noted.  Psychiatric: He has a normal mood and affect. His speech is normal and behavior is normal. Judgment and thought content normal.   BP 140/90 mmHg  Pulse 60  Temp(Src) 98.1 F (36.7 C) (Oral)  Resp 16  Ht  (1.727 m)  Wt 227 lb (102.967 kg)  BMI 34.52 kg/m2  SpO2 98%  Labs: Comments: (U/A) Spgr- 1.010, Protein- Neg, Blood-Neg, Glucose- Neg  Assessment:    Healthy male exam.  Meets standards, but periodic monitoring required due to HTN.  Driver qualified only for 1 year.    Plan:    Medical examiners certificate completed and printed. Return as  needed.  Recommend establishing care with a PCP.

## 2014-10-31 ENCOUNTER — Ambulatory Visit (INDEPENDENT_AMBULATORY_CARE_PROVIDER_SITE_OTHER): Payer: BLUE CROSS/BLUE SHIELD

## 2014-10-31 ENCOUNTER — Ambulatory Visit (INDEPENDENT_AMBULATORY_CARE_PROVIDER_SITE_OTHER): Payer: BLUE CROSS/BLUE SHIELD | Admitting: Emergency Medicine

## 2014-10-31 VITALS — BP 136/90 | HR 54 | Temp 98.0°F | Ht 68.0 in | Wt 225.8 lb

## 2014-10-31 DIAGNOSIS — M654 Radial styloid tenosynovitis [de Quervain]: Secondary | ICD-10-CM

## 2014-10-31 MED ORDER — NAPROXEN SODIUM 550 MG PO TABS: 550.0000 mg | ORAL_TABLET | Freq: Two times a day (BID) | ORAL | Status: AC

## 2014-10-31 NOTE — Progress Notes (Addendum)
Subjective:  Patient ID: Angel [name], male    DOB: 1955/05/31  Age: 59 y.o. 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 He is unable to pick up anything with his right hand without increasing the pain. He is markedly tender in the radial aspect of his wrist there is no history of injury or overuse. He over the last several days developed marked pain with some swelling with no erythema. He has no ecchymosis or deformity.  History Angel [name] has a past medical history of Hypertension; Gout; and Arthritis.   He has past surgical history that includes Replacement total knee.   His  family history is not on file.  He   reports that he has never smoked. He does not have any smokeless tobacco history on file. He reports that he drinks alcohol. He reports that he does not use illicit drugs.  Outpatient Prescriptions Prior to Visit  Medication Sig Dispense Refill  . allopurinol (ZYLOPRIM) 300 MG tablet Take 1 tablet (300 mg total) by mouth daily. 30 tablet 5  . aspirin 81 MG tablet Take 81 mg by mouth daily.    Marland Kitchen lisinopril-hydrochlorothiazide (PRINZIDE,ZESTORETIC) 10-12.5 MG per tablet TAKE 1 TABLET DAILY PT NEEDS OFFICE VISIT FOR FURTHER REFILLS 15 tablet 0  . losartan-hydrochlorothiazide (HYZAAR) 100-25 MG per tablet Take 1 tablet by mouth daily.    Marland Kitchen atenolol (TENORMIN) 50 MG tablet Take 50 mg by mouth daily.     No facility-administered medications prior to visit.    Social History   Social History  . Marital Status: Married    Spouse Name: N/A  . Number of Children: N/A  . Years of Education: N/A   Occupational History  .      Truck driver   Social History Main Topics  . Smoking status: Never Smoker   . Smokeless tobacco: None  . Alcohol Use: Yes  . Drug Use: No  . Sexual Activity: Not Asked   Other Topics Concern  . None   Social History Narrative     Review of Systems    Constitutional: Negative for fever, chills and appetite change.  HENT: Negative for congestion, ear pain, postnasal drip, sinus pressure and sore throat.   Eyes: Negative for pain and redness.  Respiratory: Negative for cough, shortness of breath and wheezing.   Cardiovascular: Negative for leg swelling.  Gastrointestinal: Negative for nausea, vomiting, abdominal pain, diarrhea, constipation and blood in stool.  Endocrine: Negative for polyuria.  Genitourinary: Negative for dysuria, urgency, frequency and flank pain.  Musculoskeletal: Negative for gait problem.  Skin: Negative for rash.  Neurological: Negative for weakness and headaches.  Psychiatric/Behavioral: Negative for confusion and decreased concentration. The patient is not nervous/anxious.     Objective:  BP 136/90 mmHg  Pulse 54  Temp(Src) 98 F (36.7 C) (Oral)  Ht 5\' 8"  (1.727 m)  Wt 225 lb 12.8 oz (102.422 kg)  BMI 34.34 kg/m2  SpO2 97%  Physical Exam  Constitutional: He is oriented to person, place, and time. He appears well-developed and well-nourished.  HENT:  Head: Normocephalic and atraumatic.  Eyes: Conjunctivae are normal. Pupils are equal, round, and reactive to light.  Pulmonary/Chest: Effort normal.  Musculoskeletal: He exhibits no edema.  Neurological: He is alert and oriented to person, place, and time.  Skin: Skin is dry.  Psychiatric: He has a normal mood and affect. His  behavior is normal. Thought content normal.   he has marked tenderness in his right wrist. There is some swelling in f the radial aspect of the wrist. Finkelstein's test is positive.    Assessment & Plan:   Treyvion was seen today for hand pain.  Diagnoses and all orders for this visit:  De Quervain's tenosynovitis, right -     DG Wrist Complete Right; Future  Other orders -     naproxen sodium (ANAPROX DS) 550 MG tablet; Take 1 tablet (550 mg total) by mouth 2 (two) times daily with a meal.   I am having Angel [name] start  on naproxen sodium. I am also having him maintain his allopurinol, aspirin, lisinopril-hydrochlorothiazide, losartan-hydrochlorothiazide, atenolol, and atorvastatin.  Meds ordered this encounter  Medications  . atenolol (TENORMIN) 100 MG tablet    Sig: Take 1 tablet by mouth daily.    Refill:  2  . atorvastatin (LIPITOR) 20 MG tablet    Sig: Take 20 mg by mouth daily.    Refill:  12  . naproxen sodium (ANAPROX DS) 550 MG tablet    Sig: Take 1 tablet (550 mg total) by mouth 2 (two) times daily with a meal.    Dispense:  40 tablet    Refill:  0   He was fitted with a short arm thumb spica splint and ask him back in a week he'll follow-up  Appropriate red flag conditions were discussed with the patient as well as actions that should be taken.  Patient expressed his understanding.  Follow-up: Return in about 1 week (around 11/07/2014).  Carmelina Dane, MD   [facility] reading (PRIMARY) by  Dr. Dareen Piano negative wrist.

## 2014-10-31 NOTE — Patient Instructions (Signed)
De Quervain's Tenosynovitis De Quervain's tenosynovitis involves inflammation of one or two tendon linings (sheaths) or strain of one or two tendons to the thumb: extensor pollicis brevis (EPB), or abductor pollicis longus (APL). This causes pain on the side of the wrist and base of the thumb. Tendon sheaths secrete a fluid that lubricates the tendon, allowing the tendon to move smoothly. When the sheath becomes inflamed, the tendon cannot move freely in the sheath. Both the EPB and APL tendons are important for proper use of the hand. The EPB tendon is important for straightening the thumb. The APL tendon is important for moving the thumb away from the index finger (abducting). The two tendons pass through a small tube (canal) in the wrist, near the base of the thumb. When the tendons become inflamed, pain is usually felt in this area. SYMPTOMS   Pain, tenderness, swelling, warmth, or redness over the base of the thumb and thumb side of the wrist.  Pain that gets worse when straightening the thumb.  Pain that gets worse when moving the thumb away from the index finger, against resistance.  Pain with pinching or gripping.  Locking or catching of the thumb.  Limited motion of the thumb.  Crackling sound (crepitation) when the tendon or thumb is moved or touched.  Fluid-filled cyst in the area of the base of the thumb. CAUSES   Tenosynovitis is often linked with overuse of the wrist.  Tenosynovitis may be caused by repeated injury to the thumb muscle and tendon units, and with repeated motions of the hand and wrist, due to friction of the tendon within the lining (sheath).  Tenosynovitis may also be due to a sudden increase in activity or change in activity. RISK INCREASES WITH:  Sports that involve repeated hand and wrist motions (golf, bowling, tennis, squash, racquetball).  Heavy labor.  Poor physical wrist strength and flexibility.  Failure to warm up properly before practice or  play.  Male gender.  New mothers who hold their baby's head for long periods or lift infants with thumbs in the infant's armpit (axilla). PREVENTION  Warm up and stretch properly before practice or competition.  Allow enough time for rest and recovery between practices and competition.  Maintain appropriate conditioning:  Cardiovascular fitness.  Forearm, wrist, and hand flexibility.  Muscle strength and endurance.  Use proper exercise technique. PROGNOSIS  This condition is usually curable within 6 weeks, if treated properly with non-surgical treatment and resting of the affected area.  RELATED COMPLICATIONS   Longer healing time if not properly treated or if not given enough time to heal.  Chronic inflammation, causing recurring symptoms of tenosynovitis. Permanent pain or restriction of movement.  Risks of surgery: infection, bleeding, injury to nerves (numbness of the thumb), continued pain, incomplete release of the tendon sheath, recurring symptoms, cutting of the tendons, tendons sliding out of position, weakness of the thumb, thumb stiffness. TREATMENT  First, treatment involves the use of medicine and ice, to reduce pain and inflammation. Patients are encouraged to stop or modify activities that aggravate the injury. Stretching and strengthening exercises may be advised. Exercises may be completed at home or with a therapist. You may be fitted with a brace or splint, to limit motion and allow the injury to heal. Your caregiver may also choose to give you a corticosteroid injection, to reduce the pain and inflammation. If non-surgical treatment is not successful, surgery may be needed. Most tenosynovitis surgeries are done as outpatient procedures (you go home the   same day). Surgery may involve local, regional (whole arm), or general anesthesia.  MEDICATION   If pain medicine is needed, nonsteroidal anti-inflammatory medicines (aspirin and ibuprofen), or other minor pain  relievers (acetaminophen), are often advised.  Do not take pain medicine for 7 days before surgery.  Prescription pain relievers are often prescribed only after surgery. Use only as directed and only as much as you need.  Corticosteroid injections may be given if your caregiver thinks they are needed. There is a limited number of times these injections may be given. COLD THERAPY   Cold treatment (icing) should be applied for 10 to 15 minutes every 2 to 3 hours for inflammation and pain, and immediately after activity that aggravates your symptoms. Use ice packs or an ice massage. SEEK MEDICAL CARE IF:   Symptoms get worse or do not improve in 2 to 4 weeks, despite treatment.  You experience pain, numbness, or coldness in the hand.  Blue, gray, or dark color appears in the fingernails.  Any of the following occur after surgery: increased pain, swelling, redness, drainage of fluids, bleeding in the affected area, or signs of infection.  New, unexplained symptoms develop. (Drugs used in treatment may produce side effects.) Document Released: 03/06/2005 Document Revised: 05/29/2011 Document Reviewed: 06/18/2008 Exit[employer] Patient Information 2015 [employer]. This information is not intended to replace advice given to you by your health care provider. Make sure you discuss any questions you have with your health care provider.   

## 2014-11-17 ENCOUNTER — Other Ambulatory Visit: Payer: Self-pay | Admitting: Emergency Medicine

## 2015-02-18 ENCOUNTER — Encounter (HOSPITAL_BASED_OUTPATIENT_CLINIC_OR_DEPARTMENT_OTHER): Payer: Self-pay | Admitting: *Deleted

## 2015-02-18 ENCOUNTER — Emergency Department (HOSPITAL_BASED_OUTPATIENT_CLINIC_OR_DEPARTMENT_OTHER)
Admission: EM | Admit: 2015-02-18 | Discharge: 2015-02-18 | Disposition: A | Discharge status: Other Acute Inpatient Hospital | Payer: Self-pay | Attending: Emergency Medicine | Admitting: Emergency Medicine

## 2015-02-18 ENCOUNTER — Emergency Department (HOSPITAL_BASED_OUTPATIENT_CLINIC_OR_DEPARTMENT_OTHER): Payer: Self-pay

## 2015-02-18 ENCOUNTER — Emergency Department (HOSPITAL_COMMUNITY): Payer: BLUE CROSS/BLUE SHIELD

## 2015-02-18 DIAGNOSIS — S064XAA Epidural hemorrhage with loss of consciousness status unknown, initial encounter: Secondary | ICD-10-CM

## 2015-02-18 DIAGNOSIS — I10 Essential (primary) hypertension: Secondary | ICD-10-CM | POA: Insufficient documentation

## 2015-02-18 DIAGNOSIS — S064X9A Epidural hemorrhage with loss of consciousness of unspecified duration, initial encounter: Secondary | ICD-10-CM

## 2015-02-18 DIAGNOSIS — Y939 Activity, unspecified: Secondary | ICD-10-CM | POA: Insufficient documentation

## 2015-02-18 DIAGNOSIS — R531 Weakness: Secondary | ICD-10-CM

## 2015-02-18 DIAGNOSIS — Y929 Unspecified place or not applicable: Secondary | ICD-10-CM | POA: Insufficient documentation

## 2015-02-18 DIAGNOSIS — S3992XA Unspecified injury of lower back, initial encounter: Secondary | ICD-10-CM | POA: Insufficient documentation

## 2015-02-18 DIAGNOSIS — S3992XS Unspecified injury of lower back, sequela: Secondary | ICD-10-CM

## 2015-02-18 DIAGNOSIS — Y999 Unspecified external cause status: Secondary | ICD-10-CM | POA: Insufficient documentation

## 2015-02-18 DIAGNOSIS — W1789XA Other fall from one level to another, initial encounter: Secondary | ICD-10-CM | POA: Insufficient documentation

## 2015-02-18 LAB — CBC WITH DIFFERENTIAL/PLATELET
Basophils Absolute: 0 10*3/uL (ref 0.0–0.1)
Basophils Relative: 0 %
EOS ABS: 0.2 10*3/uL (ref 0.0–0.7)
Eosinophils Relative: 3 %
HCT: 39.9 % (ref 39.0–52.0)
Hemoglobin: 13.9 g/dL (ref 13.0–17.0)
Lymphocytes Relative: 16 %
Lymphs Abs: 1 10*3/uL (ref 0.7–4.0)
MCH: 30 pg (ref 26.0–34.0)
MCHC: 34.8 g/dL (ref 30.0–36.0)
MCV: 86.2 fL (ref 78.0–100.0)
MONO ABS: 0.5 10*3/uL (ref 0.1–1.0)
Monocytes Relative: 8 %
Neutro Abs: 4.8 10*3/uL (ref 1.7–7.7)
Neutrophils Relative %: 73 %
PLATELETS: 165 10*3/uL (ref 150–400)
RBC: 4.63 MIL/uL (ref 4.22–5.81)
RDW: 14.3 % (ref 11.5–15.5)
WBC: 6.5 10*3/uL (ref 4.0–10.5)

## 2015-02-18 LAB — BASIC METABOLIC PANEL
Anion gap: 6 (ref 5–15)
BUN: 17 mg/dL (ref 6–20)
CO2: 28 mmol/L (ref 22–32)
CREATININE: 1.62 mg/dL — AB (ref 0.61–1.24)
Calcium: 9.2 mg/dL (ref 8.9–10.3)
Chloride: 107 mmol/L (ref 101–111)
GFR calc Af Amer: 52 mL/min — ABNORMAL LOW (ref >60)
GFR, EST NON AFRICAN AMERICAN: 45 mL/min — AB (ref >60)
Glucose, Bld: 92 mg/dL (ref 65–99)
POTASSIUM: 3.3 mmol/L — AB (ref 3.5–5.1)
SODIUM: 141 mmol/L (ref 135–145)

## 2015-02-18 MED ORDER — CYCLOBENZAPRINE HCL 10 MG PO TABS: 10.0000 mg | ORAL_TABLET | Freq: Two times a day (BID) | ORAL | Status: AC | PRN

## 2015-02-18 MED ORDER — HYDROCODONE-ACETAMINOPHEN 5-325 MG PO TABS: 2.0000 | ORAL_TABLET | ORAL | Status: AC | PRN

## 2015-02-18 MED ORDER — ONDANSETRON 8 MG PO TBDP
ORAL_TABLET | ORAL | Status: AC
Start: 2015-02-18 — End: 2015-02-18
  Administered 2015-02-18: 8 mg via ORAL
  Filled 2015-02-18: qty 1

## 2015-02-18 MED ORDER — PHENYLEPHRINE 200 MCG/ML FOR PRIAPISM / HYPOTENSION
50.0000 ug | Freq: Once | INTRAMUSCULAR | Status: DC
  Filled 2015-02-18: qty 50

## 2015-02-18 MED ORDER — DEXAMETHASONE 4 MG PO TABS
10.0000 mg | ORAL_TABLET | Freq: Once | ORAL | Status: DC
  Filled 2015-02-18: qty 3

## 2015-02-18 MED ORDER — HYDROMORPHONE HCL 1 MG/ML IJ SOLN
0.5000 mg | Freq: Once | INTRAMUSCULAR | Status: AC
  Administered 2015-02-18: 0.5 mg via INTRAVENOUS
  Filled 2015-02-18: qty 1

## 2015-02-18 MED ORDER — PREDNISONE 10 MG PO TABS: 40.0000 mg | ORAL_TABLET | Freq: Every day | ORAL | Status: AC

## 2015-02-18 MED ORDER — ONDANSETRON 8 MG PO TBDP
8.0000 mg | ORAL_TABLET | Freq: Once | ORAL | Status: AC
  Administered 2015-02-18: 8 mg via ORAL

## 2015-02-18 MED ORDER — DEXAMETHASONE SODIUM PHOSPHATE 10 MG/ML IJ SOLN
10.0000 mg | Freq: Once | INTRAMUSCULAR | Status: AC
  Administered 2015-02-18: 10 mg via INTRAVENOUS
  Filled 2015-02-18: qty 1

## 2015-02-18 NOTE — ED Notes (Signed)
IV removed per order from Dr. Cyndie ChimeNGUYEN.

## 2015-02-18 NOTE — ED Notes (Signed)
MD at bedside. 

## 2015-02-18 NOTE — ED Provider Notes (Signed)
CSN: [id number]     Arrival date & time 02/18/15  1427 History   First MD Initiated Contact with Patient 02/18/15 1503     Chief Complaint  Patient presents with  . Back Injury     (Consider location/radiation/quality/duration/timing/severity/associated sxs/prior Treatment) Patient is a 59 y.o. male presenting with back pain.  Back Pain Location:  Lumbar spine Pain severity:  Severe Onset quality:  Sudden Duration:  3 days Timing:  Constant Progression:  Unchanged Chronicity:  New Context: falling (from truck 3 days ago)   Worsened by:  Movement Associated symptoms: leg pain (was radiating down right leg, now down both legs), numbness (bottom of both feet) and weakness (legs will occasionally buckle. sometimes R Leg, sometiems L, hx knee replacemen in right, difficultly bending legs at hip)   Associated symptoms: no abdominal pain, no bladder incontinence, no bowel incontinence, no chest pain, no dysuria, no fever and no headaches     Past Medical History  Diagnosis Date  . Hypertension   . Gout   . Arthritis     knees   Past Surgical History  Procedure Laterality Date  . Replacement total knee     No family history on file. Social History  Substance Use Topics  . Smoking status: Never Smoker   . Smokeless tobacco: None  . Alcohol Use: Yes    Review of Systems  Constitutional: Negative for fever.  HENT: Negative for sore throat.   Eyes: Negative for visual disturbance.  Respiratory: Negative for shortness of breath.   Cardiovascular: Negative for chest pain.  Gastrointestinal: Negative for abdominal pain and bowel incontinence.  Genitourinary: Negative for bladder incontinence, dysuria and difficulty urinating.  Musculoskeletal: Positive for back pain. Negative for neck stiffness.  Skin: Negative for rash.  Neurological: Positive for weakness (legs will occasionally buckle. sometimes R Leg, sometiems L, hx knee replacemen in right, difficultly bending legs at  hip) and numbness (bottom of both feet). Negative for syncope and headaches.      Allergies  Review of patient's allergies indicates no known allergies.  Home Medications   Prior to Admission medications   Medication Sig Start Date End Date Taking? Authorizing Provider  aspirin 81 MG tablet Take 81 mg by mouth daily.   Yes Historical Provider, MD  atenolol (TENORMIN) 100 MG tablet Take 1 tablet by mouth daily. 08/30/14  Yes Historical Provider, MD  atorvastatin (LIPITOR) 20 MG tablet Take 20 mg by mouth daily. 08/07/14  Yes Historical Provider, MD  losartan-hydrochlorothiazide (HYZAAR) 100-25 MG per tablet Take 1 tablet by mouth daily.   Yes Historical Provider, MD  allopurinol (ZYLOPRIM) 300 MG tablet Take 1 tablet (300 mg total) by mouth daily. 08/17/12   Ethelda ChickKristi M Smith, MD  cyclobenzaprine (FLEXERIL) 10 MG tablet Take 1 tablet (10 mg total) by mouth 2 (two) times daily as needed for muscle spasms. 02/18/15   Alvira MondayErin Hermela Hardt, MD  HYDROcodone-acetaminophen (NORCO/VICODIN) 5-325 MG tablet Take 2 tablets by mouth every 4 (four) hours as needed. 02/18/15   Alvira MondayErin Kayani Rapaport, MD  lisinopril-hydrochlorothiazide (PRINZIDE,ZESTORETIC) 10-12.5 MG per tablet TAKE 1 TABLET DAILY PT NEEDS OFFICE VISIT FOR FURTHER REFILLS 09/25/13   Morrell RiddleSarah L Weber, PA-C  naproxen sodium (ANAPROX DS) 550 MG tablet Take 1 tablet (550 mg total) by mouth 2 (two) times daily with a meal. 10/31/14 10/31/15  Carmelina DaneJeffery S Anderson, MD  predniSONE (DELTASONE) 10 MG tablet Take 4 tablets (40 mg total) by mouth daily. 02/18/15 02/21/15  Alvira MondayErin Rollen Selders, MD   BP 152/80  mmHg  Pulse 54  Temp(Src) 97.8 F (36.6 C) (Oral)  Resp 18  Ht  (1.753 m)  Wt 227 lb (102.967 kg)  BMI 33.51 kg/m2  SpO2 96% Physical Exam  Constitutional: He is oriented to person, place, and time. He appears well-developed and well-nourished. No distress.  HENT:  Head: Normocephalic and atraumatic.  Eyes: Conjunctivae and EOM are normal.  Neck: Normal range of  motion.  Cardiovascular: Normal rate, regular rhythm, normal heart sounds and intact distal pulses.  Exam reveals no gallop and no friction rub.   No murmur heard. Pulmonary/Chest: Effort normal and breath sounds normal. No respiratory distress. He has no wheezes. He has no rales.  Abdominal: Soft. He exhibits no distension. There is no tenderness. There is no guarding.  Musculoskeletal: He exhibits no edema.  Neurological: He is alert and oriented to person, place, and time. A sensory deficit (left anterior thigh) is present.  Difficult to obtain all LE reflexes Mild left sided weakness with hip flexion, knee extension.  Normal dorsi/plantar flexion on my exam, normal sensation to foot   Skin: Skin is warm and dry. He is not diaphoretic.  Nursing note and vitals reviewed.   ED Course  Procedures (including critical care time) Labs Review Labs Reviewed  BASIC METABOLIC PANEL - Abnormal; Notable for the following:    Potassium 3.3 (*)    Creatinine, Ser 1.62 (*)    GFR calc non Af Amer 45 (*)    GFR calc Af Amer 52 (*)    All other components within normal limits  CBC WITH DIFFERENTIAL/PLATELET    Imaging Review Dg Lumbar Spine Complete  02/18/2015  CLINICAL DATA:  Back injury. EXAM: LUMBAR SPINE - COMPLETE 4+ VIEW COMPARISON:  None. FINDINGS: There are moderate degenerative changes in the lower lumbar spine, with disc height loss and osteophytes at L4-5 and L5-S1. No spondylolisthesis or spondylolysis. IMPRESSION: 1. Degenerative changes in the lower lumbar spine. 2.  No evidence for acute  abnormality. Electronically Signed   By: Norva Pavlov M.D.   On: 02/18/2015 16:19   Mr Lumbar Spine Wo Contrast  02/18/2015  CLINICAL DATA:  59 year old male who fell from truck 2 days ago. Lumbar back pain with lower extremity weakness greater on the right. Initial encounter. EXAM: MRI LUMBAR SPINE WITHOUT CONTRAST TECHNIQUE: Multiplanar, multisequence MR imaging of the lumbar spine was  performed. No intravenous contrast was administered. COMPARISON:  Lumbar radiographs 1605 hours today. FINDINGS: Normal lumbar segmentation depicted earlier today. Straightening of lumbar lordosis. Multilevel chronic disc and endplate degeneration. No marrow edema or evidence of acute osseous abnormality. Visible sacrum and SI joints appear intact. Visualized lower thoracic spinal cord is normal with conus medularis at T12 all 1. Partially visible bladder distension, mild to moderate. Visualized abdominal viscera and paraspinal soft tissues are within normal limits. There is a congenital degree of spinal canal narrowing noted throughout. The following superimposed degenerative changes occur: T11-T12: Circumferential disc bulge with broad-based posterior component. Moderate facet hypertrophy. Epidural lipomatosis. Mild overall spinal stenosis (series 6, image 2). Mild to moderate T11 foraminal stenosis. T12-L1: Negative disc. Epidural lipomatosis. Mild to moderate facet hypertrophy. Mild spinal stenosis overall. L1-L2: Negative disc. Epidural lipomatosis. Moderate facet hypertrophy. Mild spinal stenosis overall. L2-L3: Mild circumferential disc bulge. Superimposed central and caudal disc extrusion which is T2 and STIR hyperintense. This is best seen on series 5, image 8 and series 6, image 23. Space-occupying lesion in the midline measures up to 12 mm CC and could  be either a disc material or epidural blood (Favor the latter see series 6, image 22). Superimposed epidural lipomatosis. Moderate facet hypertrophy. Trace facet joint fluid. Overall severe spinal and right greater than left lateral recess stenosis. No foraminal involvement. L3-L4: Left far lateral eccentric circumferential disc bulge. Moderate facet hypertrophy. Trace facet joint fluid. Epidural lipomatosis. Overall moderate spinal stenosis (series 6, image 28. Mild L3 foraminal stenosis. L4-L5: Severe disc space loss. Bulky circumferential disc osteophyte  complex. Right eccentric posterior component. Moderate facet and ligament flavum hypertrophy. Epidural lipomatosis. Overall moderate to severe spinal stenosis with severe right lateral recess stenosis. Mild left and moderate to severe right L4 foraminal stenosis. L5-S1: Disc space loss with bulky left eccentric circumferential disc osteophyte complex. Moderate facet hypertrophy. Overall severe left lateral recess stenosis. Borderline to mild spinal stenosis. Severe left and moderate right L5 foraminal stenosis. IMPRESSION: 1. 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 2. Symptomatic level strongly favored to be L2-L3 where spinal stenosis is severe an greater on the right. Suspect central disc herniation with associated small volume epidural hemorrhage at this level (less likely sequestered disc fragment; see series 6, image 22). 3. Multifactorial stenosis otherwise most pronounced at L4-L5; moderate to severe including up to severe right lateral recess and right foraminal stenosis. 4. There is also moderate to severe multifactorial bilateral foraminal stenosis at L5-S1. Electronically Signed   By: Odessa Fleming M.D.   On: 02/18/2015 19:51   I have personally reviewed and evaluated these images and lab results as part of my medical decision-making.   EKG Interpretation None      MDM   Final diagnoses:  Back injury, sequela  Weakness  Epidural hematoma (HCC) versus disc herniation at L2   59yo male with history of htn, gout, presents as transfer from [facility] for concern for back pain and leg weakness.  Patient fell 3 days ago, and has been having back pain and difficulty walking.  MR lumbar spine ordered showing congenital and acquired spinal stenosis with area of L2-L3 with possible central disc herniation with small volume of epidural hemorrhage.  On my exam, patient with mild left sided hip flexor and knee flexor weakness on left, however is able to stand independently  and ambulate with care. No bowel/bladder symptoms.  Discussed with Dr. Venetia Maxon, Neurosurgery, who reviewed the images and also believes images most consistent with small amount of epidural hemorrhage.  Given no bowel/bladder symptoms, mild weakness, do not feel patient requires emergent surgery, and expect improvement as epidural hemorrhage improves and recommend close follow up with Dr. Venetia Maxon on Monday or return to ED if symptoms worsen.  Discussed reasons to return to ED with patient in detail.  Gave rx for flexeril, norco, prednisone for 4 days. Patient received decadron at [facility].  Patient discharged in stable condition with understanding of reasons to return.     Alvira Monday, MD 02/19/15 1248

## 2015-02-18 NOTE — ED Notes (Signed)
He slipped and fell off a truck hauler 2 days ago. He landed on is right side. Pain in his lower back with numbness in both legs.

## 2015-02-18 NOTE — ED Notes (Signed)
Pt given urinal to void and instructed to call nurses station when finished.

## 2015-02-18 NOTE — ED Notes (Signed)
Patient verbalized understanding of discharge instructions and denies any further needs or questions at this time. VS stable. Assisted to lobby in wheelchair.

## 2015-02-18 NOTE — ED Provider Notes (Signed)
CSN: [id number]     Arrival date & time 02/18/15  1427 History   First MD Initiated Contact with Patient 02/18/15 1503     Chief Complaint  Patient presents with  . Back Injury     (Consider location/radiation/quality/duration/timing/severity/associated sxs/prior Treatment) HPI Comments: 59 y.o. Male with history of HTN, gout, arthritis presents for back pain.  The patient reports that about 2 days ago he fell off of his truck and that he has had pain in his lower back since that time as well weakness in his bilateral lower legs which he feels is worse in the right leg.  He reports that when he has been walking a few times since the injury he has felt like his legs gave out on him and his knees have buckled and he has fallen to the ground.  Reports tingling in both legs. Reports normal bowel and bladder function.  Denies saddle anesthesia.  No fevers or chills. No history of previous back injury/pain or leg weakness.   Past Medical History  Diagnosis Date  . Hypertension   . Gout   . Arthritis     knees   Past Surgical History  Procedure Laterality Date  . Replacement total knee     No family history on file. Social History  Substance Use Topics  . Smoking status: Never Smoker   . Smokeless tobacco: None  . Alcohol Use: Yes    Review of Systems  Constitutional: Negative for fever, chills and fatigue.  HENT: Negative for congestion, rhinorrhea and sinus pressure.   Respiratory: Negative for cough, chest tightness and shortness of breath.   Gastrointestinal: Negative for nausea, vomiting, abdominal pain and diarrhea.  Genitourinary: Negative for dysuria, frequency, hematuria, decreased urine volume and difficulty urinating.  Musculoskeletal: Positive for back pain and gait problem. Negative for myalgias, joint swelling, neck pain and neck stiffness.  Skin: Negative for rash.  Neurological: Positive for weakness and numbness. Negative for dizziness and headaches.  Hematological:  Does not bruise/bleed easily.      Allergies  Review of patient's allergies indicates no known allergies.  Home Medications   Prior to Admission medications   Medication Sig Start Date End Date Taking? Authorizing Provider  allopurinol (ZYLOPRIM) 300 MG tablet Take 1 tablet (300 mg total) by mouth daily. 08/17/12   Ethelda ChickKristi M Smith, MD  aspirin 81 MG tablet Take 81 mg by mouth daily.    Historical Provider, MD  atenolol (TENORMIN) 100 MG tablet Take 1 tablet by mouth daily. 08/30/14   Historical Provider, MD  atorvastatin (LIPITOR) 20 MG tablet Take 20 mg by mouth daily. 08/07/14   Historical Provider, MD  lisinopril-hydrochlorothiazide (PRINZIDE,ZESTORETIC) 10-12.5 MG per tablet TAKE 1 TABLET DAILY PT NEEDS OFFICE VISIT FOR FURTHER REFILLS 09/25/13   Morrell RiddleSarah L Weber, PA-C  losartan-hydrochlorothiazide (HYZAAR) 100-25 MG per tablet Take 1 tablet by mouth daily.    Historical Provider, MD  naproxen sodium (ANAPROX DS) 550 MG tablet Take 1 tablet (550 mg total) by mouth 2 (two) times daily with a meal. 10/31/14 10/31/15  Carmelina DaneJeffery S Anderson, MD   BP 185/104 mmHg  Pulse 50  Temp(Src) 98.2 F (36.8 C) (Oral)  Resp 18  Ht 5\' 9"  (1.753 m)  Wt 227 lb (102.967 kg)  BMI 33.51 kg/m2  SpO2 100% Physical Exam  Constitutional: He is oriented to person, place, and time. He appears well-developed and well-nourished. No distress.  HENT:  Head: Normocephalic and atraumatic.  Right Ear: External ear normal.  Left  Ear: External ear normal.  Mouth/Throat: Oropharynx is clear and moist. No oropharyngeal exudate.  Eyes: EOM are normal. Pupils are equal, round, and reactive to light.  Neck: Normal range of motion. Neck supple.  Cardiovascular: Normal rate, regular rhythm, normal heart sounds and intact distal pulses.   No murmur heard. Pulmonary/Chest: Effort normal. No respiratory distress. He has no wheezes. He has no rales.  Abdominal: Soft. He exhibits no distension. There is no tenderness.    Musculoskeletal: He exhibits no edema.       Right hip: He exhibits decreased strength. He exhibits normal range of motion.       Left hip: He exhibits normal range of motion and normal strength.       Right knee: Normal.       Left knee: Normal.       Right ankle: Normal.       Left ankle: Normal.       Lumbar back: He exhibits decreased range of motion, tenderness, bony tenderness and pain. He exhibits no edema, no deformity, no spasm and normal pulse.       Back:  Neurological: He is alert and oriented to person, place, and time.  Mildly decreased strength on plantar felxion of the bilateral feet.  Patient not able to hold right leg off of the bed against gravity.  Can hold left leg for about 10 seconds.  Reports decreased sensation in right leg.  Reports normal sensation in the saddle region.  Able to stand on tip toes on examination but not able to stand on heels.  Skin: Skin is warm and dry. No rash noted. He is not diaphoretic.  Vitals reviewed.   ED Course  Procedures (including critical care time) Labs Review Labs Reviewed  BASIC METABOLIC PANEL - Abnormal; Notable for the following:    Potassium 3.3 (*)    Creatinine, Ser 1.62 (*)    GFR calc non Af Amer 45 (*)    GFR calc Af Amer 52 (*)    All other components within normal limits  CBC WITH DIFFERENTIAL/PLATELET    Imaging Review Dg Lumbar Spine Complete  02/18/2015  CLINICAL DATA:  Back injury. EXAM: LUMBAR SPINE - COMPLETE 4+ VIEW COMPARISON:  None. FINDINGS: There are moderate degenerative changes in the lower lumbar spine, with disc height loss and osteophytes at L4-5 and L5-S1. No spondylolisthesis or spondylolysis. IMPRESSION: 1. Degenerative changes in the lower lumbar spine. 2.  No evidence for acute  abnormality. Electronically Signed   By: Norva Pavlov M.D.   On: 02/18/2015 16:19   I have personally reviewed and evaluated these images and lab results as part of my medical decision-making.   EKG  Interpretation None      MDM  Patient seen and evaluated in stable condition.  Patient with complaint of leg weakness and recurrent fall.  Bilateral leg weakness although on examination asymmetric. Patient is able to void with normal post void residual but continues to complain of weakness after treatment and patient without PCP or follow up.  Discussed with Dr. Dalene Seltzer who agreed with ED to ED transfer.  Patient transferred for MRI completion of the lumbar spine without contrast.  Patient and wife in agreement with plan of care.  If MRI without acute finding patient to be discharged with pain medicine and muscle relaxant. Final diagnoses:  None    1. Back injury  2. Leg weakness    Leta Baptist, MD 02/18/15 [phone]

## 2015-02-23 ENCOUNTER — Encounter (HOSPITAL_COMMUNITY): Payer: Self-pay | Admitting: *Deleted

## 2015-02-23 ENCOUNTER — Other Ambulatory Visit: Payer: Self-pay | Admitting: Neurosurgery

## 2015-02-23 NOTE — Progress Notes (Signed)
Pt Angel [name]-pre-op call completed by both pt and spouse with consent. Pt denies SOB and chest pain  Currently, but is under the care of Dr. Donnie Ahoilley, cardiology. Pt stated that he last experienced chest pain 2-3 weeks ago when lifting a brick. Pt stated that he had a stress test done at Dr. York Spanielilley's office; records requested. Pt and spouse made aware to stop taking Aspirin, otc vitamins, fish oil, and herbal medications. Do not take any NSAIDs ie: Ibuprofen, Advil, Naproxen or any medication containing Aspirin. Both pt and spouse verbalized understanding of pre-op instructions.

## 2015-02-24 ENCOUNTER — Encounter (HOSPITAL_COMMUNITY): Payer: Self-pay | Admitting: Vascular Surgery

## 2015-02-24 MED ORDER — CEFAZOLIN SODIUM-DEXTROSE 2-3 GM-% IV SOLR
2.0000 g | INTRAVENOUS | Status: AC
  Administered 2015-02-25: 2 g via INTRAVENOUS
  Filled 2015-02-24: qty 50

## 2015-02-24 NOTE — Progress Notes (Signed)
Anesthesia Chart Review: SAME DAY WORK-UP.  Patient is a 59 year old male scheduled for L2-3 laminectomy tomorrow (after 4 PM) by Dr. Venetia MaxonStern. DX: Lumbar stenosis.  History includes non-smoker, obesity, HTN, CKD (stage III by cardiology notes), GERD, gout, right TKA, hernia repair. Cardiologist is Dr. Viann FishSpencer Tilley, last visit 06/08/14 for vertigo and bradycardia. He was first established around 2013 for HTN management. PCP was Dr. Dennis BastYuri Cabeza, but no longer sees due to MD moving. He says he needs to find a new PCP. Nephrologist is Dr. Lowell GuitarPowell with [facility]. He works as a Sports coachtruck driver/car hauler.   PAT phone RN forwarded chart for review due to patient reporting a history of chest pain when lifting a brick 2-3 weeks ago. He denied recurrent chest pain since. No SOB. [facility] and was [facility] for concern of back pain and bilateral leg weakness following a fall off his truck 2-3 days prior. MRI L-spine showed congenital and acquired spinal stenosis with area of L2-L3 with possible central disc herniation with small volume of epidural hemorrhage, moderate to severe L4-L5 and L5-S1. Symptoms felt associated with L2-3 severe stenosis with epidural hemorrhage. Neurosurgeon Dr. Venetia MaxonStern called and recommended Flexeril, Norco and prednisone with out-patient follow-up 02/22/15.  I called patient to find out more details about his CP episode. He was taking pole fence down and had been working for about 7 hours. He was trying to lift 2 large cement blocks and developed chest pain at the left side just below his neck. No associated SOB, nausea, or diaphoresis. Has had similar symptoms months ago when reaching under cars to hook up the chain. He was told it could be a rotator cuff injury. He has had a total of two episodes in the past year. Pain subsided after resting approximately two minutes. He then went back to moving 2 bricks at a time for another two hours without recurrent  symptoms. Patient is not aware of any CAD/MI/CABG/PCI in his family.  No DM or smoking. Denied LE swelling. Symptoms of BLE weakness and N/T have not improved since 02/18/15, so Dr. Venetia MaxonStern recommended proceeding with surgery. Bowel and bladder function remain in tack.   Meds include allopurinol, ASA 81 mg, Lipitor, Flexeril, Norco, lisinopril-HCTZ, losartan-HCTZ, Anaprox.   06/04/14 EKG: SB at 49 bpm. First degree AVB. Q waves in lateral leads--but right and left arm limb lead reversal suspected.   Per 06/08/14 records by Dr. Donnie Ahoilley: - 01/23/14-02/21/14 Event monitor: Multiple events were transmitted during the monitoring period. He is NSR with occasional PVCs noted. No correlation of symptoms with arrhythmia.  - 10/04/11 Echo: LVEF 60% - 09/2011 Treadmill  02/18/15 labs showed Cr 1.62, previously 1.30-1.36 05/2014. K+ 3.3, glucose 92. CBC WNL.   He will need new EKG on arrival.   Discussed above with anesthesiologist Dr. Jacklynn BueMassagee. Patient had an episode of chest pain with high level exertion approximately 3 weeks ago without recurrence. Now he has symptomatic severe L2-3 stenosis with epidural hemorrhage. Symptoms have not improved on steroids. He will get an EKG on arrival to assess for new changes. Dr. Jacklynn BueMassagee felt that due to the nature of the procedure/patient's symptoms that he could likely proceed if EKG is stable and no new/progressive symptoms. Further evaluation on the day of surgery by his assigned anesthesiologist to determine the definitive anesthesia plan.   Velna Ochsllison Jamielee Mchale, PA-C [facility]/Anesthesiology Phone [phone] 02/24/2015 12:58 PM

## 2015-02-25 ENCOUNTER — Ambulatory Visit (HOSPITAL_COMMUNITY): Payer: Worker's Compensation | Admitting: Vascular Surgery

## 2015-02-25 ENCOUNTER — Ambulatory Visit (HOSPITAL_COMMUNITY): Payer: Worker's Compensation

## 2015-02-25 ENCOUNTER — Encounter (HOSPITAL_COMMUNITY): Payer: Self-pay | Admitting: *Deleted

## 2015-02-25 ENCOUNTER — Encounter (HOSPITAL_COMMUNITY): Admission: AD | Disposition: E | Payer: 59 | Source: Ambulatory Visit | Attending: Neurosurgery

## 2015-02-25 ENCOUNTER — Inpatient Hospital Stay (HOSPITAL_COMMUNITY)
Admission: AD | Admit: 2015-02-25 | Discharge: 2015-03-21 | DRG: 028 | Disposition: E | Payer: Worker's Compensation | Source: Ambulatory Visit | Attending: Neurosurgery | Admitting: Neurosurgery

## 2015-02-25 DIAGNOSIS — R200 Anesthesia of skin: Secondary | ICD-10-CM | POA: Diagnosis present

## 2015-02-25 DIAGNOSIS — I469 Cardiac arrest, cause unspecified: Secondary | ICD-10-CM | POA: Diagnosis not present

## 2015-02-25 DIAGNOSIS — Z6833 Body mass index (BMI) 33.0-33.9, adult: Secondary | ICD-10-CM

## 2015-02-25 DIAGNOSIS — I129 Hypertensive chronic kidney disease with stage 1 through stage 4 chronic kidney disease, or unspecified chronic kidney disease: Secondary | ICD-10-CM | POA: Diagnosis present

## 2015-02-25 DIAGNOSIS — S33121A Dislocation of L2/L3 lumbar vertebra, initial encounter: Secondary | ICD-10-CM | POA: Diagnosis present

## 2015-02-25 DIAGNOSIS — M549 Dorsalgia, unspecified: Secondary | ICD-10-CM | POA: Diagnosis present

## 2015-02-25 DIAGNOSIS — G8220 Paraplegia, unspecified: Secondary | ICD-10-CM | POA: Diagnosis present

## 2015-02-25 DIAGNOSIS — M4806 Spinal stenosis, lumbar region: Secondary | ICD-10-CM | POA: Diagnosis present

## 2015-02-25 DIAGNOSIS — N183 Chronic kidney disease, stage 3 (moderate): Secondary | ICD-10-CM | POA: Diagnosis present

## 2015-02-25 DIAGNOSIS — Z96652 Presence of left artificial knee joint: Secondary | ICD-10-CM | POA: Diagnosis present

## 2015-02-25 DIAGNOSIS — W11XXXA Fall on and from ladder, initial encounter: Secondary | ICD-10-CM | POA: Diagnosis present

## 2015-02-25 DIAGNOSIS — S330XXA Traumatic rupture of lumbar intervertebral disc, initial encounter: Secondary | ICD-10-CM | POA: Diagnosis present

## 2015-02-25 DIAGNOSIS — I2699 Other pulmonary embolism without acute cor pulmonale: Secondary | ICD-10-CM | POA: Diagnosis not present

## 2015-02-25 DIAGNOSIS — Z419 Encounter for procedure for purposes other than remedying health state, unspecified: Secondary | ICD-10-CM

## 2015-02-25 DIAGNOSIS — E669 Obesity, unspecified: Secondary | ICD-10-CM | POA: Diagnosis present

## 2015-02-25 DIAGNOSIS — S064X0A Epidural hemorrhage without loss of consciousness, initial encounter: Secondary | ICD-10-CM | POA: Diagnosis present

## 2015-02-25 DIAGNOSIS — Z9181 History of falling: Secondary | ICD-10-CM

## 2015-02-25 DIAGNOSIS — I959 Hypotension, unspecified: Secondary | ICD-10-CM | POA: Diagnosis not present

## 2015-02-25 HISTORY — DX: Gastro-esophageal reflux disease without esophagitis: K21.9

## 2015-02-25 HISTORY — DX: Chronic kidney disease, unspecified: N18.9

## 2015-02-25 HISTORY — PX: LUMBAR LAMINECTOMY/DECOMPRESSION MICRODISCECTOMY: SHX5026

## 2015-02-25 SURGERY — LUMBAR LAMINECTOMY/DECOMPRESSION MICRODISCECTOMY 1 LEVEL
Anesthesia: General | Site: Spine Lumbar

## 2015-02-25 MED ORDER — LIDOCAINE HCL (CARDIAC) 20 MG/ML IV SOLN
INTRAVENOUS | Status: DC | PRN
  Administered 2015-02-25: 80 mg via INTRAVENOUS

## 2015-02-25 MED ORDER — SODIUM CHLORIDE 0.9 % IV SOLN: 250.0000 mL | INTRAVENOUS | Status: DC

## 2015-02-25 MED ORDER — GLYCOPYRROLATE 0.2 MG/ML IJ SOLN
INTRAMUSCULAR | Status: DC | PRN
  Administered 2015-02-25: .8 mg via INTRAVENOUS

## 2015-02-25 MED ORDER — HYDROCODONE-ACETAMINOPHEN 5-325 MG PO TABS
1.0000 | ORAL_TABLET | ORAL | Status: DC | PRN
  Administered 2015-02-25: 2 via ORAL
  Filled 2015-02-25: qty 2

## 2015-02-25 MED ORDER — LIDOCAINE HCL (CARDIAC) 20 MG/ML IV SOLN
INTRAVENOUS | Status: AC
  Filled 2015-02-25: qty 5

## 2015-02-25 MED ORDER — LOSARTAN POTASSIUM-HCTZ 100-25 MG PO TABS: 1.0000 | ORAL_TABLET | Freq: Every day | ORAL | Status: DC

## 2015-02-25 MED ORDER — SODIUM CHLORIDE 0.9 % IJ SOLN: 3.0000 mL | INTRAMUSCULAR | Status: DC | PRN

## 2015-02-25 MED ORDER — ONDANSETRON HCL 4 MG/2ML IJ SOLN
INTRAMUSCULAR | Status: AC
  Filled 2015-02-25: qty 2

## 2015-02-25 MED ORDER — EPHEDRINE SULFATE 50 MG/ML IJ SOLN
INTRAMUSCULAR | Status: DC | PRN
  Administered 2015-02-25 (×2): 5 mg via INTRAVENOUS

## 2015-02-25 MED ORDER — NAPROXEN 250 MG PO TABS
500.0000 mg | ORAL_TABLET | Freq: Two times a day (BID) | ORAL | Status: DC
  Administered 2015-02-26: 500 mg via ORAL
  Filled 2015-02-25: qty 2

## 2015-02-25 MED ORDER — CEFAZOLIN SODIUM 1-5 GM-% IV SOLN
1.0000 g | Freq: Three times a day (TID) | INTRAVENOUS | Status: AC
  Administered 2015-02-26 (×2): 1 g via INTRAVENOUS
  Filled 2015-02-25 (×2): qty 50

## 2015-02-25 MED ORDER — ARTIFICIAL TEARS OP OINT
TOPICAL_OINTMENT | OPHTHALMIC | Status: DC | PRN
  Administered 2015-02-25: 1 via OPHTHALMIC

## 2015-02-25 MED ORDER — PROPOFOL 10 MG/ML IV BOLUS
INTRAVENOUS | Status: DC | PRN
  Administered 2015-02-25: 200 mg via INTRAVENOUS
  Administered 2015-02-25: 20 mg via INTRAVENOUS

## 2015-02-25 MED ORDER — ONDANSETRON HCL 4 MG/2ML IJ SOLN
INTRAMUSCULAR | Status: DC | PRN
  Administered 2015-02-25: 4 mg via INTRAVENOUS

## 2015-02-25 MED ORDER — ATORVASTATIN CALCIUM 10 MG PO TABS
20.0000 mg | ORAL_TABLET | Freq: Every day | ORAL | Status: DC
  Administered 2015-02-26: 20 mg via ORAL
  Filled 2015-02-25: qty 2

## 2015-02-25 MED ORDER — ALLOPURINOL 100 MG PO TABS
300.0000 mg | ORAL_TABLET | Freq: Every day | ORAL | Status: DC
  Administered 2015-02-26: 300 mg via ORAL
  Filled 2015-02-25: qty 3

## 2015-02-25 MED ORDER — LISINOPRIL-HYDROCHLOROTHIAZIDE 10-12.5 MG PO TABS: 1.0000 | ORAL_TABLET | Freq: Every day | ORAL | Status: DC

## 2015-02-25 MED ORDER — HYDROCODONE-ACETAMINOPHEN 5-325 MG PO TABS: 1.0000 | ORAL_TABLET | ORAL | Status: DC | PRN

## 2015-02-25 MED ORDER — BUPIVACAINE HCL (PF) 0.5 % IJ SOLN
INTRAMUSCULAR | Status: DC | PRN
  Administered 2015-02-25: 5 mL

## 2015-02-25 MED ORDER — METHOCARBAMOL 500 MG PO TABS
500.0000 mg | ORAL_TABLET | Freq: Four times a day (QID) | ORAL | Status: DC | PRN
  Administered 2015-02-25: 500 mg via ORAL
  Filled 2015-02-25: qty 1

## 2015-02-25 MED ORDER — FENTANYL CITRATE (PF) 100 MCG/2ML IJ SOLN
INTRAMUSCULAR | Status: DC | PRN
  Administered 2015-02-25: 100 ug via INTRAVENOUS
  Administered 2015-02-25 (×2): 50 ug via INTRAVENOUS

## 2015-02-25 MED ORDER — ACETAMINOPHEN 650 MG RE SUPP: 650.0000 mg | RECTAL | Status: DC | PRN

## 2015-02-25 MED ORDER — METHOCARBAMOL 1000 MG/10ML IJ SOLN: 500.0000 mg | Freq: Four times a day (QID) | INTRAVENOUS | Status: DC | PRN

## 2015-02-25 MED ORDER — ALUM & MAG HYDROXIDE-SIMETH 200-200-20 MG/5ML PO SUSP: 30.0000 mL | Freq: Four times a day (QID) | ORAL | Status: DC | PRN

## 2015-02-25 MED ORDER — KCL IN DEXTROSE-NACL 20-5-0.45 MEQ/L-%-% IV SOLN
INTRAVENOUS | Status: DC
  Administered 2015-02-26: via INTRAVENOUS
  Filled 2015-02-25: qty 1000

## 2015-02-25 MED ORDER — POLYETHYLENE GLYCOL 3350 17 G PO PACK: 17.0000 g | PACK | Freq: Every day | ORAL | Status: DC | PRN

## 2015-02-25 MED ORDER — HEMOSTATIC AGENTS (NO CHARGE) OPTIME
TOPICAL | Status: DC | PRN
  Administered 2015-02-25: 1 via TOPICAL

## 2015-02-25 MED ORDER — OXYCODONE-ACETAMINOPHEN 5-325 MG PO TABS
1.0000 | ORAL_TABLET | ORAL | Status: DC | PRN
  Administered 2015-02-26: 2 via ORAL
  Filled 2015-02-25: qty 2

## 2015-02-25 MED ORDER — SODIUM CHLORIDE 0.9 % IJ SOLN: 3.0000 mL | Freq: Two times a day (BID) | INTRAMUSCULAR | Status: DC

## 2015-02-25 MED ORDER — NEOSTIGMINE METHYLSULFATE 10 MG/10ML IV SOLN
INTRAVENOUS | Status: DC | PRN
  Administered 2015-02-25: 5 mg via INTRAVENOUS

## 2015-02-25 MED ORDER — HYDRALAZINE HCL 20 MG/ML IJ SOLN
10.0000 mg | Freq: Once | INTRAMUSCULAR | Status: AC
  Administered 2015-02-25: 10 mg via INTRAVENOUS

## 2015-02-25 MED ORDER — VECURONIUM BROMIDE 10 MG IV SOLR
INTRAVENOUS | Status: DC | PRN
  Administered 2015-02-25: 1 mg via INTRAVENOUS

## 2015-02-25 MED ORDER — ONDANSETRON HCL 4 MG/2ML IJ SOLN
4.0000 mg | INTRAMUSCULAR | Status: DC | PRN
  Administered 2015-02-25: 4 mg via INTRAVENOUS
  Filled 2015-02-25: qty 2

## 2015-02-25 MED ORDER — FLEET ENEMA 7-19 GM/118ML RE ENEM: 1.0000 | ENEMA | Freq: Once | RECTAL | Status: DC | PRN

## 2015-02-25 MED ORDER — ROCURONIUM BROMIDE 50 MG/5ML IV SOLN
INTRAVENOUS | Status: AC
  Filled 2015-02-25: qty 1

## 2015-02-25 MED ORDER — PROPOFOL 10 MG/ML IV BOLUS
INTRAVENOUS | Status: AC
  Filled 2015-02-25: qty 20

## 2015-02-25 MED ORDER — ASPIRIN EC 81 MG PO TBEC
81.0000 mg | DELAYED_RELEASE_TABLET | Freq: Every day | ORAL | Status: DC
  Administered 2015-02-26: 81 mg via ORAL
  Filled 2015-02-25: qty 1

## 2015-02-25 MED ORDER — NAPROXEN SODIUM 550 MG PO TABS: 550.0000 mg | ORAL_TABLET | Freq: Two times a day (BID) | ORAL | Status: DC

## 2015-02-25 MED ORDER — ROCURONIUM BROMIDE 100 MG/10ML IV SOLN
INTRAVENOUS | Status: DC | PRN
  Administered 2015-02-25: 50 mg via INTRAVENOUS

## 2015-02-25 MED ORDER — LIDOCAINE-EPINEPHRINE 1 %-1:100000 IJ SOLN
INTRAMUSCULAR | Status: DC | PRN
  Administered 2015-02-25: 5 mL

## 2015-02-25 MED ORDER — DOCUSATE SODIUM 100 MG PO CAPS
100.0000 mg | ORAL_CAPSULE | Freq: Two times a day (BID) | ORAL | Status: DC
  Administered 2015-02-25 – 2015-02-26 (×2): 100 mg via ORAL
  Filled 2015-02-25 (×2): qty 1

## 2015-02-25 MED ORDER — HYDROMORPHONE HCL 1 MG/ML IJ SOLN: 0.5000 mg | INTRAMUSCULAR | Status: DC | PRN

## 2015-02-25 MED ORDER — HYDRALAZINE HCL 20 MG/ML IJ SOLN
INTRAMUSCULAR | Status: AC
  Administered 2015-02-25: 20:00:00
  Filled 2015-02-25: qty 1

## 2015-02-25 MED ORDER — CYCLOBENZAPRINE HCL 10 MG PO TABS
10.0000 mg | ORAL_TABLET | Freq: Two times a day (BID) | ORAL | Status: DC | PRN
  Administered 2015-02-25: 10 mg via ORAL

## 2015-02-25 MED ORDER — MENTHOL 3 MG MT LOZG: 1.0000 | LOZENGE | OROMUCOSAL | Status: DC | PRN

## 2015-02-25 MED ORDER — BISACODYL 10 MG RE SUPP: 10.0000 mg | Freq: Every day | RECTAL | Status: DC | PRN

## 2015-02-25 MED ORDER — ACETAMINOPHEN 325 MG PO TABS: 650.0000 mg | ORAL_TABLET | ORAL | Status: DC | PRN

## 2015-02-25 MED ORDER — FENTANYL CITRATE (PF) 250 MCG/5ML IJ SOLN
INTRAMUSCULAR | Status: AC
  Filled 2015-02-25: qty 5

## 2015-02-25 MED ORDER — MIDAZOLAM HCL 2 MG/2ML IJ SOLN
INTRAMUSCULAR | Status: AC
  Filled 2015-02-25: qty 2

## 2015-02-25 MED ORDER — ATENOLOL 100 MG PO TABS
100.0000 mg | ORAL_TABLET | Freq: Every day | ORAL | Status: DC
  Administered 2015-02-26: 100 mg via ORAL
  Filled 2015-02-25: qty 1

## 2015-02-25 MED ORDER — PANTOPRAZOLE SODIUM 40 MG IV SOLR
40.0000 mg | Freq: Every day | INTRAVENOUS | Status: DC
Start: 2015-02-25 — End: 2015-02-26
  Administered 2015-02-25: 40 mg via INTRAVENOUS
  Filled 2015-02-25: qty 40

## 2015-02-25 MED ORDER — THROMBIN 5000 UNITS EX SOLR
CUTANEOUS | Status: DC | PRN
  Administered 2015-02-25 (×2): 5000 [IU] via TOPICAL

## 2015-02-25 MED ORDER — LACTATED RINGERS IV SOLN
INTRAVENOUS | Status: DC
  Administered 2015-02-25 (×2): via INTRAVENOUS

## 2015-02-25 MED ORDER — CYCLOBENZAPRINE HCL 10 MG PO TABS
ORAL_TABLET | ORAL | Status: AC
  Filled 2015-02-25: qty 1

## 2015-02-25 MED ORDER — 0.9 % SODIUM CHLORIDE (POUR BTL) OPTIME
TOPICAL | Status: DC | PRN
  Administered 2015-02-25: 1000 mL

## 2015-02-25 MED ORDER — MIDAZOLAM HCL 5 MG/5ML IJ SOLN
INTRAMUSCULAR | Status: DC | PRN
  Administered 2015-02-25: 2 mg via INTRAVENOUS

## 2015-02-25 MED ORDER — PHENOL 1.4 % MT LIQD
1.0000 | OROMUCOSAL | Status: DC | PRN
  Administered 2015-02-26: 1 via OROMUCOSAL
  Filled 2015-02-25: qty 177

## 2015-02-25 SURGICAL SUPPLY — 53 items
APL SKNCLS STERI-STRIP NONHPOA (GAUZE/BANDAGES/DRESSINGS)
BENZOIN TINCTURE PRP APPL 2/3 (GAUZE/BANDAGES/DRESSINGS) IMPLANT
BIT DRILL NEURO 2X3.1 SFT TUCH (MISCELLANEOUS) ×1 IMPLANT
BLADE CLIPPER SURG (BLADE) IMPLANT
BUR ROUND FLUTED 5 RND (BURR) ×2 IMPLANT
CANISTER SUCT 3000ML PPV (MISCELLANEOUS) ×2 IMPLANT
DECANTER SPIKE VIAL GLASS SM (MISCELLANEOUS) ×2 IMPLANT
DERMABOND ADVANCED (GAUZE/BANDAGES/DRESSINGS) ×1
DERMABOND ADVANCED .7 DNX12 (GAUZE/BANDAGES/DRESSINGS) ×1 IMPLANT
DRAPE LAPAROTOMY 100X72X124 (DRAPES) ×2 IMPLANT
DRAPE MICROSCOPE LEICA (MISCELLANEOUS) ×2 IMPLANT
DRAPE POUCH INSTRU U-SHP 10X18 (DRAPES) ×2 IMPLANT
DRAPE SURG 17X23 STRL (DRAPES) ×2 IMPLANT
DRILL NEURO 2X3.1 SOFT TOUCH (MISCELLANEOUS) ×2
DRSG OPSITE POSTOP 4X6 (GAUZE/BANDAGES/DRESSINGS) ×2 IMPLANT
DURAPREP 26ML APPLICATOR (WOUND CARE) ×2 IMPLANT
ELECT REM PT RETURN 9FT ADLT (ELECTROSURGICAL) ×2
ELECTRODE REM PT RTRN 9FT ADLT (ELECTROSURGICAL) ×1 IMPLANT
GAUZE SPONGE 4X4 12PLY STRL (GAUZE/BANDAGES/DRESSINGS) IMPLANT
GAUZE SPONGE 4X4 16PLY XRAY LF (GAUZE/BANDAGES/DRESSINGS) IMPLANT
GLOVE BIO SURGEON STRL SZ8 (GLOVE) ×2 IMPLANT
GLOVE BIOGEL PI IND STRL 8 (GLOVE) ×1 IMPLANT
GLOVE BIOGEL PI IND STRL 8.5 (GLOVE) ×1 IMPLANT
GLOVE BIOGEL PI INDICATOR 8 (GLOVE) ×1
GLOVE BIOGEL PI INDICATOR 8.5 (GLOVE) ×1
GLOVE ECLIPSE 8.0 STRL XLNG CF (GLOVE) ×2 IMPLANT
GLOVE EXAM NITRILE LRG STRL (GLOVE) IMPLANT
GLOVE EXAM NITRILE MD LF STRL (GLOVE) IMPLANT
GLOVE EXAM NITRILE XL STR (GLOVE) IMPLANT
GLOVE EXAM NITRILE XS STR PU (GLOVE) IMPLANT
GOWN STRL REUS W/ TWL LRG LVL3 (GOWN DISPOSABLE) IMPLANT
GOWN STRL REUS W/ TWL XL LVL3 (GOWN DISPOSABLE) ×1 IMPLANT
GOWN STRL REUS W/TWL 2XL LVL3 (GOWN DISPOSABLE) ×2 IMPLANT
GOWN STRL REUS W/TWL LRG LVL3 (GOWN DISPOSABLE)
GOWN STRL REUS W/TWL XL LVL3 (GOWN DISPOSABLE) ×1
KIT BASIN OR (CUSTOM PROCEDURE TRAY) ×2 IMPLANT
KIT ROOM TURNOVER OR (KITS) ×2 IMPLANT
NEEDLE HYPO 18GX1.5 BLUNT FILL (NEEDLE) IMPLANT
NEEDLE HYPO 25X1 1.5 SAFETY (NEEDLE) ×2 IMPLANT
NS IRRIG 1000ML POUR BTL (IV SOLUTION) ×4 IMPLANT
PACK LAMINECTOMY NEURO (CUSTOM PROCEDURE TRAY) ×2 IMPLANT
PAD ARMBOARD 7.5X6 YLW CONV (MISCELLANEOUS) ×6 IMPLANT
RUBBERBAND STERILE (MISCELLANEOUS) ×4 IMPLANT
SPONGE SURGIFOAM ABS GEL SZ50 (HEMOSTASIS) ×2 IMPLANT
STRIP CLOSURE SKIN 1/2X4 (GAUZE/BANDAGES/DRESSINGS) IMPLANT
SUT VIC AB 0 CT1 18XCR BRD8 (SUTURE) ×1 IMPLANT
SUT VIC AB 0 CT1 8-18 (SUTURE) ×1
SUT VIC AB 2-0 CT1 18 (SUTURE) ×2 IMPLANT
SUT VIC AB 3-0 SH 8-18 (SUTURE) ×2 IMPLANT
SYR 5ML LL (SYRINGE) IMPLANT
TOWEL OR 17X24 6PK STRL BLUE (TOWEL DISPOSABLE) ×2 IMPLANT
TOWEL OR 17X26 10 PK STRL BLUE (TOWEL DISPOSABLE) ×2 IMPLANT
WATER STERILE IRR 1000ML POUR (IV SOLUTION) ×2 IMPLANT

## 2015-02-25 NOTE — Brief Op Note (Signed)
03/05/2015  7:11 PM  PATIENT:  Angel [name]  58 y.o. 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  POST-OPERATIVE DIAGNOSIS:  Same with traumatic disc herniation  PROCEDURE:  Procedure(s) with comments: Lumbar Two-Three  Laminectomy (N/A) - L2-3 Laminectomy with decompression, evacuation of epidural hematoma and removal of traumatic disc herniation  SURGEON:  Surgeon(s) and Role:    * Jahsiah Carpenter, MD - Primary  PHYSICIAN ASSISTANT:   ASSISTANTS: Poteat, RN   ANESTHESIA:   general  EBL:     BLOOD ADMINISTERED:none  DRAINS: (Medium) Hemovact drain(s) in the epidural space with  Suction Open   LOCAL MEDICATIONS USED:  LIDOCAINE   SPECIMEN:  No Specimen  DISPOSITION OF SPECIMEN:  N/A  COUNTS:  YES  TOURNIQUET:  * No tourniquets in log *  DICTATION: DICTATION: Patient has severe spinal stenosis with epidural hematoma 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 It was elected to take him to surgery for L 2 through L 3 laminectomy with evacuation of hematoma.  Procedure: Patient was brought to the operating room and following the smooth and uncomplicated induction of general endotracheal anesthesia she was placed in a prone position on the Wilson frame. Low back was prepped and draped in the usual sterile fashion with betadine scrub and DuraPrep. Area of planned incision was infiltrated with local lidocaine. Preop localizing X ray was obtained.  Incision was made in the midline and carried to the lumbodorsal fascia which was incised on both sides of midline. Subperiosteal dissection was performed exposing what was felt to be L 23 level. Intraoperative x-ray demonstrated marker probe at L 23.  A total laminectomy of L 2  and L 3 was performed with leksell, then a high-speed drill and completed with Kerrison rongeurs and generous foraminotomies were performed to decompress the L 2,  L 3, and L 4 nerves bilaterally. Ligamentum flavum was detached and removed in a piecemeal fashion and epidural hematoma was evacuated with palpation along the floor of the spinal canal.  While doing so with a coronary dilator from the right, I was able to mobilize atraumatic disc fragment and I removed this in its entirety with significant decompression of the thecal sac.  The wound was then irrigated with bacitracin saline. Hemostasis was assured. A medium Hemovac drain was placed.  The lumbodorsal fascia was closed with 0 Vicryl sutures the subcutaneous tissues reapproximated 2-0 Vicryl inverted sutures and the skin edges were reapproximated with 3-0 Vicryl subcuticular stitch. The wound was dressed with Dermabond and an occlusive dressing. Patient was extubated in the operating room and taken to recovery in stable and satisfactory condition having tolerated his operation well counts were correct at the end of the case.    PLAN OF CARE: Admit to inpatient   PATIENT DISPOSITION:  PACU - hemodynamically stable.   Delay start of Pharmacological VTE agent (>24hrs) due to surgical blood loss or risk of bleeding: yes  

## 2015-02-25 NOTE — Transfer of Care (Signed)
Immediate Anesthesia Transfer of Care Note  Patient: Angel [name]  Procedure(s) Performed: Procedure(s) with comments: Lumbar Two-Three  Laminectomy (N/A) - L2-3 Laminectomy  Patient Location: PACU  Anesthesia Type:General  Level of Consciousness: sedated and patient cooperative  Airway & Oxygen Therapy: Patient connected to nasal cannula oxygen  Post-op Assessment: Report given to RN, Post -op Vital signs reviewed and stable and Patient moving all extremities X 4  Post vital signs: Reviewed and stable  Last Vitals:  Filed Vitals:   02/22/2015 1501 03/19/2015 1923  BP: 174/104   Pulse: 60   Temp: 36.9 C 37.1 C  Resp: 18     Complications: No apparent anesthesia complications

## 2015-02-25 NOTE — Progress Notes (Signed)
Dr Chaney MallingHodierne aware of elevated BP by CRNA , new order for IV Apresoline. Will cont to monitor

## 2015-02-25 NOTE — Anesthesia Procedure Notes (Signed)
Procedure Name: Intubation Date/Time: 02/21/2015 5:39 PM Performed by: Dairl PonderJIANG, Emoni Whitworth Pre-anesthesia Checklist: Patient identified, Timeout performed, Emergency Drugs available, Suction available and Patient being monitored Patient Re-evaluated:Patient Re-evaluated prior to inductionOxygen Delivery Method: Circle system utilized Preoxygenation: Pre-oxygenation with 100% oxygen Intubation Type: IV induction Ventilation: Mask ventilation without difficulty and Oral airway inserted - appropriate to patient size Laryngoscope Size: Mac and 4 Grade View: Grade I Tube type: Oral Tube size: 7.5 mm Number of attempts: 1 Airway Equipment and Method: Stylet Placement Confirmation: ETT inserted through vocal cords under direct vision,  breath sounds checked- equal and bilateral and positive ETCO2 Secured at: 23 cm Tube secured with: Tape Dental Injury: Teeth and Oropharynx as per pre-operative assessment

## 2015-02-25 NOTE — Anesthesia Preprocedure Evaluation (Signed)
Anesthesia Evaluation  Patient identified by MRN, date of birth, ID band Patient awake    Reviewed: Allergy & Precautions, NPO status , Patient's Chart, lab work & pertinent test results  Airway Mallampati: II  TM Distance: >3 FB Neck ROM: Full    Dental no notable dental hx.    Pulmonary neg pulmonary ROS,    Pulmonary exam normal breath sounds clear to auscultation       Cardiovascular hypertension, Pt. on medications and Pt. on home beta blockers Normal cardiovascular exam Rhythm:Regular Rate:Normal     Neuro/Psych negative neurological ROS  negative psych ROS   GI/Hepatic negative GI ROS, Neg liver ROS,   Endo/Other  negative endocrine ROS  Renal/GU negative Renal ROS  negative genitourinary   Musculoskeletal negative musculoskeletal ROS (+)   Abdominal   Peds negative pediatric ROS (+)  Hematology negative hematology ROS (+)   Anesthesia Other Findings   Reproductive/Obstetrics negative OB ROS                             Anesthesia Physical Anesthesia Plan  ASA: II  Anesthesia Plan: General   Post-op Pain Management:    Induction: Intravenous  Airway Management Planned: Oral ETT  Additional Equipment:   Intra-op Plan:   Post-operative Plan: Extubation in OR  Informed Consent: I have reviewed the patients History and Physical, chart, labs and discussed the procedure including the risks, benefits and alternatives for the proposed anesthesia with the patient or authorized representative who has indicated his/her understanding and acceptance.   Dental advisory given  Plan Discussed with: CRNA and Surgeon  Anesthesia Plan Comments:         Anesthesia Quick Evaluation  

## 2015-02-25 NOTE — Progress Notes (Signed)
Awake, alert, able to lift both legs off the bed with significant improvement in hip flexor weakness bilaterally.  Doing well.

## 2015-02-25 NOTE — Interval H&P Note (Signed)
History and Physical Interval Note:  03/17/2015 1:44 PM  Angel [name]  has presented today for surgery, with the diagnosis of Lumbar stenosis  The various methods of treatment have been discussed with the patient and family. After consideration of risks, benefits and other options for treatment, the patient has consented to  Procedure(s) with comments: L2-3 Laminectomy (N/A) - L2-3 Laminectomy as a surgical intervention .  The patient's history has been reviewed, patient examined, no change in status, stable for surgery.  I have reviewed the patient's chart and labs.  Questions were answered to the patient's satisfaction.     Gurman Ashland D

## 2015-02-25 NOTE — H&P (Signed)
Patient ID:   [id number] Patient: Angel [name]  Date of Birth: 1955/05/26 Visit Type: Office Visit   Date: 02/22/2015 11:30 AM Provider: Danae OrleansJoseph D. Venetia MaxonStern MD   This 59 year old male presents for back pain.  History of Present Illness: 1.  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  He reports lumbar pain, numbness and tingling in both legs, and states either leg gives way causing falls.  Percocet, muscle relaxer, steroid Dosepak were prescribed in the emergency department.  History: HTN Surgical history: Right TKR 2010, hernia repair years ago  MRI and x-rays on [place]  The patient is in a wheelchair.  He has significant weakness in both of his legs.  He states that he fell on his bottom off a car hauler and this occurred while he was working and it was pouring rain.  The patient complains of 5-6 out of 10 in severity of low back pain.  He says his legs are getting weaker.  I evaluated his MRI of his lumbar spine and this shows significant spinal stenosis at the L2-3 level with what appears to be epidural lipomatosis and also with the radiologist has interpreted as epidural hematoma which is causing ventral canal stenosis and severe compression of the thecal sac.  It is possible that this may represent an acute disc herniation, although both the radiologist and I favor epidural hemorrhage as a likely cause.  The patient's wife is concerned that he has been falling frequently since this injury.        PAST MEDICAL/SURGICAL HISTORY   (Detailed)     PAST MEDICAL HISTORY, SURGICAL HISTORY, FAMILY HISTORY, SOCIAL HISTORY AND REVIEW OF SYSTEMS I have reviewed the patient's past medical, surgical, family and social history as well as the comprehensive review of systems as included on the [facility] history form dated 02/22/2015, which I have  signed.  Family History  (Detailed)   SOCIAL HISTORY  (Detailed) Tobacco use reviewed. Preferred language is [place].   Smoking status: Never smoker.  SMOKING STATUS Use Status Type Smoking Status Usage Per Day Years Used Total Pack Years  no/never  Never smoker             MEDICATIONS(added, continued or stopped this visit):   ALLERGIES: Ingredient Reaction Medication Name Comment  NO KNOWN ALLERGIES     No known allergies.   REVIEW OF SYSTEMS System Neg/Pos Details  Constitutional Negative Chills, fatigue, fever, malaise, night sweats, weight gain and weight loss.  ENMT Negative Ear drainage, hearing loss, nasal drainage, otalgia, sinus pressure and sore throat.  Eyes Negative Eye discharge, eye pain and vision changes.  Respiratory Negative Chronic cough, cough, dyspnea, known TB exposure and wheezing.  Cardio Negative Chest pain, claudication, edema and irregular heartbeat/palpitations.  GI Negative Abdominal pain, blood in stool, change in stool pattern, constipation, decreased appetite, diarrhea, heartburn, nausea and vomiting.  GU Negative Dribbling, dysuria, erectile dysfunction, hematuria, polyuria, slow stream, urinary frequency, urinary incontinence and urinary retention.  Endocrine Negative Cold intolerance, heat intolerance, polydipsia and polyphagia.  Neuro Positive Extremity weakness, Numbness in extremity.  Psych Negative Anxiety, depression and insomnia.  Integumentary Negative Brittle hair, brittle nails, change in shape/size of mole(s), hair loss, hirsutism, hives, pruritus, rash and skin lesion.  MS Positive Back pain.  Hema/Lymph Negative Easy bleeding, easy bruising and lymphadenopathy.  Allergic/Immuno Negative Contact allergy, environmental allergies,  food allergies and seasonal allergies.  Reproductive Negative Penile discharge and sexual dysfunction.     Vitals Date Temp F BP Pulse Ht In Wt Lb BMI BSA Pain Score  02/22/2015  166/88 59 69  227 33.52  6/10     PHYSICAL EXAM General Level of Distress: no acute distress Overall Appearance: normal    Cardiovascular Cardiac: regular rate and rhythm without murmur  Respiratory Lungs: clear to auscultation  Neurological Recent and Remote Memory: normal Attention Span and Concentration:   normal Language: normal Fund of Knowledge: normal  Right Left Sensation: normal normal Upper Extremity Coordination: normal normal  Lower Extremity Coordination: normal normal  Musculoskeletal Gait and Station: normal  Right Left Upper Extremity Muscle Strength: normal normal Lower Extremity Muscle Strength: normal normal Upper Extremity Muscle Tone:  normal normal Lower Extremity Muscle Tone: normal normal  Motor Strength Upper and lower extremity motor strength was tested in the clinically pertinent muscles. Any abnormal findings will be noted below.   Right Left Hip Flexor: 4-/5 3/5 Knee Extensor: 4/5 4-/5   Deep Tendon Reflexes  Right Left Biceps: normal normal Triceps: normal normal Brachiloradialis: normal normal Patellar: decrease decrease Achilles: normal normal  Sensory Sensation was tested at L1 to S1.   Cranial Nerves II. Optic Nerve/Visual Fields: normal III. Oculomotor: normal IV. Trochlear: normal V. Trigeminal: normal VI. Abducens: normal VII. Facial: normal VIII. Acoustic/Vestibular: normal IX. Glossopharyngeal: normal X. Vagus: normal XI. Spinal Accessory: normal XII. Hypoglossal: normal  Motor and other Tests Lhermittes: negative Rhomberg: negative    Right Left Hoffman's: normal normal Clonus: normal normal Babinski: normal normal SLR: negative negative Patrick's Pearlean Brownie): negative negative Toe Walk: normal normal Toe Lift: normal normal Heel Walk: normal normal SI Joint: nontender nontender   Additional Findings:  The patient notes numbness in the bottom of both feet.  He denies numbness in his perineum and has had no  trouble with bowel or bladder function.  The patient is seated in a wheelchair.    IMPRESSION The patient has severe spinal stenosis at L2-3 with ventral epidural hematoma versus herniated disc.  He is getting increasing weakness in his lower extremities and is currently in a wheelchair.  Based on these findings I have recommended expedited surgery and this will consist of L2 through L3 laminectomy.  This has been scheduled for 2015/02/28.  Assessment/Plan # Detail Type Description   1. Assessment Disc displacement, lumbar (M51.26).       2. Assessment Radiculopathy, lumbar region (M54.16).       3. Assessment Epidural hematoma (S06.4X9A).       4. Assessment Fall from mobile elevated work platform as cause of accidental injury (Z61.09UE).       5. Assessment Lumbar stenosis (M48.06).         Pain Assessment/Treatment Pain Scale: 6/10. Method: Numeric Pain Intensity Scale. Location: back. Onset: 02/16/2015. Duration: varies. Quality: discomforting. Pain Assessment/Treatment follow-up plan of care: Patient is taking medications as prescribed..  The risks and benefits of surgery were discussed with the patient and the plan is to proceed with L2 through L3 laminectomy for spinal stenosis and epidural hematoma versus herniated disc on 2015/02/28.  Orders: Diagnostic Procedures: Assessment Procedure  M48.06 Laminectomy - L2-L3             Provider:  Danae Orleans. Venetia Maxon MD  02/24/2015 08:40 AM Dictation edited by: Danae Orleans. Venetia Maxon    CC Providers: Maeola Harman MD [address] [place], [place] [address]  Electronically signed by Danae Orleans. Venetia Maxon MD on 02/24/2015 08:40 AM

## 2015-02-25 NOTE — Op Note (Signed)
03/05/2015  7:11 PM  PATIENT:  Angel [name]  59 y.o. 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  POST-OPERATIVE DIAGNOSIS:  Same with traumatic disc herniation  PROCEDURE:  Procedure(s) with comments: Lumbar Two-Three  Laminectomy (N/A) - L2-3 Laminectomy with decompression, evacuation of epidural hematoma and removal of traumatic disc herniation  SURGEON:  Surgeon(s) and Role:    * Maeola HarmanJoseph Brayten Komar, MD - Primary  PHYSICIAN ASSISTANT:   ASSISTANTS: Poteat, RN   ANESTHESIA:   general  EBL:     BLOOD ADMINISTERED:none  DRAINS: (Medium) Hemovact drain(s) in the epidural space with  Suction Open   LOCAL MEDICATIONS USED:  LIDOCAINE   SPECIMEN:  No Specimen  DISPOSITION OF SPECIMEN:  N/A  COUNTS:  YES  TOURNIQUET:  * No tourniquets in log *  DICTATION: DICTATION: Patient has severe spinal stenosis with epidural hematoma 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 It was elected to take him to surgery for L 2 through L 3 laminectomy with evacuation of hematoma.  Procedure: Patient was brought to the operating room and following the smooth and uncomplicated induction of general endotracheal anesthesia she was placed in a prone position on the Wilson frame. Low back was prepped and draped in the usual sterile fashion with betadine scrub and DuraPrep. Area of planned incision was infiltrated with local lidocaine. Preop localizing X ray was obtained.  Incision was made in the midline and carried to the lumbodorsal fascia which was incised on both sides of midline. Subperiosteal dissection was performed exposing what was felt to be L 23 level. Intraoperative x-ray demonstrated marker probe at L 23.  A total laminectomy of L 2  and L 3 was performed with leksell, then a high-speed drill and completed with Kerrison rongeurs and generous foraminotomies were performed to decompress the L 2,  L 3, and L 4 nerves bilaterally. Ligamentum flavum was detached and removed in a piecemeal fashion and epidural hematoma was evacuated with palpation along the floor of the spinal canal.  While doing so with a coronary dilator from the right, I was able to mobilize atraumatic disc fragment and I removed this in its entirety with significant decompression of the thecal sac.  The wound was then irrigated with bacitracin saline. Hemostasis was assured. A medium Hemovac drain was placed.  The lumbodorsal fascia was closed with 0 Vicryl sutures the subcutaneous tissues reapproximated 2-0 Vicryl inverted sutures and the skin edges were reapproximated with 3-0 Vicryl subcuticular stitch. The wound was dressed with Dermabond and an occlusive dressing. Patient was extubated in the operating room and taken to recovery in stable and satisfactory condition having tolerated his operation well counts were correct at the end of the case.    PLAN OF CARE: Admit to inpatient   PATIENT DISPOSITION:  PACU - hemodynamically stable.   Delay start of Pharmacological VTE agent (>24hrs) due to surgical blood loss or risk of bleeding: yes

## 2015-02-26 ENCOUNTER — Encounter (HOSPITAL_COMMUNITY): Payer: Self-pay | Admitting: Neurosurgery

## 2015-02-26 LAB — COMPREHENSIVE METABOLIC PANEL
ALBUMIN: 3.5 g/dL (ref 3.5–5.0)
ALT: 37 U/L (ref 17–63)
AST: 40 U/L (ref 15–41)
Alkaline Phosphatase: 71 U/L (ref 38–126)
Anion gap: 13 (ref 5–15)
BUN: 24 mg/dL — ABNORMAL HIGH (ref 6–20)
CHLORIDE: 106 mmol/L (ref 101–111)
CO2: 19 mmol/L — AB (ref 22–32)
CREATININE: 1.9 mg/dL — AB (ref 0.61–1.24)
Calcium: 8.3 mg/dL — ABNORMAL LOW (ref 8.9–10.3)
GFR calc non Af Amer: 37 mL/min — ABNORMAL LOW (ref >60)
GFR, EST AFRICAN AMERICAN: 43 mL/min — AB (ref >60)
GLUCOSE: 79 mg/dL (ref 65–99)
Potassium: 4.9 mmol/L (ref 3.5–5.1)
SODIUM: 138 mmol/L (ref 135–145)
Total Bilirubin: 1.8 mg/dL — ABNORMAL HIGH (ref 0.3–1.2)
Total Protein: 6.5 g/dL (ref 6.5–8.1)

## 2015-02-26 LAB — GLUCOSE, CAPILLARY: GLUCOSE-CAPILLARY: 110 mg/dL — AB (ref 65–99)

## 2015-02-26 LAB — CBC
HCT: 40.8 % (ref 39.0–52.0)
Hemoglobin: 14.2 g/dL (ref 13.0–17.0)
MCH: 30.9 pg (ref 26.0–34.0)
MCHC: 34.8 g/dL (ref 30.0–36.0)
MCV: 88.9 fL (ref 78.0–100.0)
PLATELETS: 100 10*3/uL — AB (ref 150–400)
RBC: 4.59 MIL/uL (ref 4.22–5.81)
RDW: 15.6 % — AB (ref 11.5–15.5)
WBC: 15.5 10*3/uL — AB (ref 4.0–10.5)

## 2015-02-26 LAB — TROPONIN I: Troponin I: <0.03 ng/mL (ref <0.031)

## 2015-02-26 LAB — LACTIC ACID, PLASMA: LACTIC ACID, VENOUS: 6.3 mmol/L — AB (ref 0.5–2.0)

## 2015-02-26 MED ORDER — PROMETHAZINE HCL 25 MG/ML IJ SOLN: 6.2500 mg | INTRAMUSCULAR | Status: DC | PRN

## 2015-02-26 MED ORDER — HYDROMORPHONE HCL 1 MG/ML IJ SOLN: 0.2500 mg | INTRAMUSCULAR | Status: DC | PRN

## 2015-02-26 MED ORDER — PANTOPRAZOLE SODIUM 40 MG PO TBEC: 40.0000 mg | DELAYED_RELEASE_TABLET | Freq: Every day | ORAL | Status: DC

## 2015-02-27 MED FILL — Medication: Qty: 1 | Status: AC

## 2015-03-01 NOTE — Anesthesia Postprocedure Evaluation (Signed)
Anesthesia Post Note  Patient: Angel [name]  Procedure(s) Performed: Procedure(s) (LRB): Lumbar Two-Three  Laminectomy (N/A)  Patient location during evaluation: PACU Anesthesia Type: General Level of consciousness: awake and alert and patient cooperative Pain management: pain level controlled Vital Signs Assessment: post-procedure vital signs reviewed and stable Respiratory status: spontaneous breathing and respiratory function stable Cardiovascular status: stable Anesthetic complications: no    Last Vitals:  Filed Vitals:   25-Jul-2014 0938 25-Jul-2014 1409  BP: 102/62 106/57  Pulse: 64 65  Temp: 36.8 C 36.6 C  Resp: 20 20    Last Pain:  Filed Vitals:   25-Jul-2014 1410  PainSc: 1                  Chaise Passarella S

## 2015-03-05 MED FILL — Tenecteplase For IV Soln Kit 50 MG: INTRAVENOUS | Qty: 10 | Status: AC

## 2015-03-21 NOTE — Code Documentation (Signed)
CODE BLUE NOTE  Patient Name: Angel [name]   MRN: [id number]   Date of Birth/ Sex: Jun 21, 1955 , male      Admission Date: 03/14/2015  Attending Provider: Maeola HarmanJoseph Stern, MD  Primary Diagnosis: Lumbar stenosis  S/p L2-3 laminectomy    Indication: Pt was in his usual state of health until this PM, when he was noted to be gasping for air and wife called for help. Code blue was subsequently called. At the time of arrival on scene, ACLS protocol was underway. Patient went into PEA arrest then asystole. There was high suspicion for PE; cord was felt on patient's left calf. TPA was administered without improvement.     Technical Description:  - CPR performance duration:  >30 minutes  - Was defibrillation or cardioversion used? Yes   - Was external pacer placed? No  - Was patient intubated pre/post CPR? Yes    Medications Administered: Y = Yes; Blank = No Amiodarone   Y  Atropine   N  Calcium   N  Epinephrine   Y x 8  Lidocaine   N  Magnesium   Y  Norepinephrine   N  Phenylephrine   N  Sodium bicarbonate   Y  Vasopressin   N    Post CPR evaluation:  - Final Status - Was patient successfully resuscitated ? No   Miscellaneous Information:  - Time of death:  [id number] PM  - Primary team notified?  Yes  - Family Notified? Yes        Casey BurkittHillary Moen Fitzgerald, MD   03/12/2015, 10:23 PM

## 2015-03-21 NOTE — Care Management Note (Addendum)
Case Management Note  Patient Details  Name: Lieutenant DiegoJerry W Soberanes MRN: [id number] Date of Birth: [birth year]  Subjective/Objective:  Patient admitted with Traumatic rupture of lumbar intervertebral disc. Patient underwent a L2-3 Lami with decompression and hematoma evacuation. Patient is from home with his spouse.  Patient has a Merchandiser, retailworkmans comp claim. CM called [employer] his workmans comp carrier and spoke with Beth [phone]). She was not aware of his claim. Mr Uvaldo RisingMcNeil is going to call his work ([employer]) and have them contact the [employer] claim center.               Action/Plan: Plan currently is for home with home health services. CM will continue to follow for discharge needs.   Expected Discharge Date:  02/28/15               Expected Discharge Plan:  Home w Home Health Services  In-House Referral:     Discharge planning Services  CM Consult  Post Acute Care Choice:    Choice offered to:     DME Arranged:    DME Agency:     HH Arranged:    HH Agency:     Status of Service:  In process, will continue to follow  Medicare Important Message Given:    Date Medicare IM Given:    Medicare IM give by:    Date Additional Medicare IM Given:    Additional Medicare Important Message give by:     If discussed at Long Length of Stay Meetings, dates discussed:    Additional Comments:  Kermit BaloKelli F Jhase Creppel, RN 02/23/2015, 3:58 PM

## 2015-03-21 NOTE — Progress Notes (Signed)
I was called to see patient who had become unresponsive and developed asystole and CPR was conducted for greater than 30 minutes without recovery.  He expired.  We discussed situation with patient's wife.  Likely explanations were felt to possibly be PE or MI.

## 2015-03-21 NOTE — Progress Notes (Signed)
Occupational Therapy Evaluation Patient Details Name: Angel [name] MRN: [id number] DOB: Jul 18, 1955 Today's Date: 03/22/2015    History of Present Illness Pt is 60 y.o. male s/p L2-3 laminectomy following lumbar stenosis diagnosis. PMHx includes obesity, HTN, CKD (stage III by cardiology notes), GERD, gout, right TKA, hernia repair.   Clinical Impression   PTA, pt independent with ADL and mobility and drove an semi-truck. Began education regarding compensatory techniques and back precautions for ADL. Pt with BLE weakness and needs to mobilize with RW at this time. Mod vc to follow back precautions.Will plan to see tomorrow to continue with education on back precautions and ADL retraining with pt/wife to facilitate safe D/C home.    Follow Up Recommendations  Supervision/Assistance - 24 hour    Equipment Recommendations  None recommended by OT    Recommendations for Other Services       Precautions / Restrictions Precautions Precautions: Back Precaution Booklet Issued: Yes (comment) Precaution Comments: Discussed precautions and log roll technique Required Braces or Orthoses: none      Mobility Bed Mobility Overal bed mobility: Needs Assistance Bed Mobility: Rolling;Sit to Sidelying Rolling: Supervision Sidelying to sit: Min guard       General bed mobility comments: vc to follow back precautions  Transfers Overall transfer level: Needs assistance Equipment used: 1 person hand held assist Transfers: Sit to/from [employer] Sit to Stand: Min assist Stand pivot transfers: Min assist       General transfer comment: vc for technique. Apparent BLE weakness    Balance Overall balance assessment: Needs assistance Sitting-balance support: Feet supported Sitting balance-Leahy Scale: Good     Standing balance support: No upper extremity supported Standing balance-Leahy Scale: Fair Standing balance comment: Was able to ambulate stairs and stand  without RW. Required RW for stability while walking                            ADL Overall ADL's : Needs assistance/impaired     Grooming: Supervision/safety   Upper Body Bathing: Set up;Supervision/ safety;Sitting   Lower Body Bathing: Sit to/from stand;Moderate assistance   Upper Body Dressing : Set up;Supervision/safety;Sitting   Lower Body Dressing: Moderate assistance;Sit to/from stand   Toilet Transfer: Minimal assistance   Toileting- Clothing Manipulation and Hygiene: Moderate assistance Toileting - Clothing Manipulation Details (indicate cue type and reason): Educated on use of fulsheable wipes and toilet tongs if needed   Tub/Shower Transfer Details (indicate cue type and reason): Discussed using 3in1 for shower Functional mobility during ADLs: Minimal assistance General ADL Comments: Needs to use RW for mobility. Trying to lean for objects without it. Educated on back precuations however, mod vc to comply. wife will assist with ADL but will benefit from education. REcommended pt purchase reacher for ADL and home use. Began education on compensatory techniques for LB ADL.      Vision     Perception     Praxis      Pertinent Vitals/Pain Pain Assessment: 0-10 Pain Score: 2  Pain Location: back Pain Descriptors / Indicators: Aching Pain Intervention(s): Limited activity within patient's tolerance;Monitored during session     Hand Dominance Right   Extremity/Trunk Assessment Upper Extremity Assessment Upper Extremity Assessment: Overall WFL for tasks assessed   Lower Extremity Assessment Lower Extremity Assessment: Defer to PT evaluation   Cervical / Trunk Assessment Cervical / Trunk Assessment: Normal   Communication Communication Communication: No difficulties   Cognition Arousal/Alertness: Awake/alert Behavior During  Therapy: WFL for tasks assessed/performed Overall Cognitive Status: Within Functional Limits for tasks assessed                      General Comments       Exercises       Shoulder Instructions      Home Living Family/patient expects to be discharged to:: Private residence Living Arrangements: Spouse/significant other Available Help at Discharge: Family Type of Home: House       Home Layout: Multi-level Alternate Level Stairs-Number of Steps: 8, can reach railing on both sides   Bathroom Shower/Tub: Tub/shower unit;Walk-in shower   Bathroom Toilet: Standard Bathroom Accessibility: Yes   Home Equipment: Walker - 2 wheels;Crutches;Bedside commode          Prior Functioning/Environment Level of Independence: Independent             OT Diagnosis: Generalized weakness;Acute pain   OT Problem List: Decreased strength;Decreased range of motion;Decreased activity tolerance;Impaired balance (sitting and/or standing);Decreased safety awareness;Decreased knowledge of use of DME or AE;Decreased knowledge of precautions;Obesity;Pain   OT Treatment/Interventions: Self-care/ADL training;DME and/or AE instruction;Therapeutic activities;Patient/family education;Balance training    OT Goals(Current goals can be found in the care plan section) Acute Rehab OT Goals Patient Stated Goal: Return home OT Goal Formulation: With patient Time For Goal Achievement: 03/05/15 Potential to Achieve Goals: Good ADL Goals Pt Will Perform Lower Body Bathing: with set-up;with supervision;with caregiver independent in assisting;sit to/from stand;with adaptive equipment Pt Will Perform Lower Body Dressing: with set-up;with supervision;with adaptive equipment;with caregiver independent in assisting;sit to/from stand Pt Will Transfer to Toilet: with supervision;ambulating;bedside commode (with caregiver independent in assisting) Pt Will Perform Toileting - Clothing Manipulation and hygiene: with supervision;sit to/from stand;with adaptive equipment;sitting/lateral leans Pt Will Perform Tub/Shower Transfer: with  min guard assist;3 in 1;rolling walker;Shower transfer;with caregiver independent in assisting Additional ADL Goal #1: pt/wife will verbalize understanding of 3/3 back precuations for ADL and funcitonal mobility for ADL  OT Frequency: Min 2X/week   Barriers to D/C:            Co-evaluation              End of Session Nurse Communication: Mobility status;Precautions  Activity Tolerance: Patient tolerated treatment well Patient left: in bed;with call bell/phone within reach;with bed alarm set   Time: [id number] OT Time Calculation (min): 26 min Charges:  OT General Charges $OT Visit: 1 Procedure OT Evaluation $Initial OT Evaluation Tier I: 1 Procedure OT Treatments $Self Care/Home Management : 8-22 mins G-Codes:    Amaira Safley,HILLARY 03/19/2015, 1:51 PM   [facility] Akasha Melena, OTR/L  [phone] 03/13/2015

## 2015-03-21 NOTE — Progress Notes (Signed)
Subjective: Patient reports "i'm just a little sore in my back" "I feel stronger"  Objective: Vital signs in last 24 hours: Temp:  [97.4 F (36.3 C)-98.7 F (37.1 C)] 98.2 F (36.8 C) (12/09 0938) Pulse Rate:  [57-64] 64 (12/09 0938) Resp:  [13-21] 20 (12/09 0938) BP: (102-183)/(58-111) 102/62 mmHg (12/09 0938) SpO2:  [95 %-99 %] 98 % (12/09 0938) Weight:  [102.967 kg (227 lb)] 102.967 kg (227 lb) (12/08 2319)  Intake/Output from previous day: 12/08 0701 - 12/09 0700 In: 1280 [P.O.:60; I.V.:1220] Out: 730 [Urine:600; Drains:80; Blood:50] Intake/Output this shift: Total I/O In: 120 [P.O.:120] Out: -   Wound: Alert, conversant. Wife present. Pt reports only lumbar soreness. Incision without erythema, swelling , or drainage. Hemovac patent ~2250ml overnight, reservoir with some blood now. Improved strength BLE.   Lab Results: No results for input(s): WBC, HGB, HCT, PLT in the last 72 hours. BMET No results for input(s): NA, K, CL, CO2, GLUCOSE, BUN, CREATININE, CALCIUM in the last 72 hours.  Studies/Results: Dg Lumbar Spine 2-3 Views  03/15/2015  CLINICAL DATA:  L2-3 laminectomy EXAM: LUMBAR SPINE - 2-3 VIEW COMPARISON:  02/18/2015 FINDINGS: Two lateral views of the lumbar spine were obtained intraoperatively. The initial film demonstrates a needle within the posterior soft tissues at the L2-3 level. Subsequent surgical retractor in instruments are noted at the L2-3 level. The numbering nomenclature similar to that utilized on the prior MRI. IMPRESSION: Intraoperative localization at L2-3 Electronically Signed   By: Alcide CleverMark  Lukens M.D.   On: 02/24/2015 18:34    Assessment/Plan: Improving   LOS: 1 day  Work with PT/OT today, leave Hemovac in place today. Mobilize.    Georgiann Cockeroteat, Jhon Mallozzi 02/24/2015, 9:49 AM

## 2015-03-21 NOTE — Evaluation (Signed)
Physical Therapy Evaluation Patient Details Name: Angel [name] MRN: [id number] DOB: 27-Sep-1955 Today's Date: 03/03/2015   History of Present Illness  Pt is 60 y.o. male s/p L2-3 laminectomy following lumbar stenosis diagnosis. PMHx includes obesity, HTN, CKD (stage III by cardiology notes), GERD, gout, right TKA, hernia repair.  Clinical Impression  Pt was eager to get OOB today. He presents with decreased awareness of precautions, general LE weakness, and decreased capacity for transfers. Wife and pt were educated on precautions and transferring with staff. He will benefit from acute PT to improve safety with functional mobility and LE strength to decrease burden of care. When medically stable, D/C to home is appropriate.     Follow Up Recommendations No PT follow up    Equipment Recommendations  None recommended by PT    Recommendations for Other Services OT consult     Precautions / Restrictions Precautions Precautions: Back Precaution Booklet Issued: Yes (comment) Precaution Comments: Discussed precautions and log roll technique      Mobility  Bed Mobility Overal bed mobility: Needs Assistance Bed Mobility: Rolling;Sidelying to Sit Rolling: Supervision Sidelying to sit: Supervision       General bed mobility comments: Use of bed rails. Increased time. VC for log roll technique, hand placement, and maintaining precautions  Transfers Overall transfer level: Needs assistance Equipment used: Rolling walker (2 wheeled) Transfers: Sit to/from Stand Sit to Stand: Min assist;From elevated surface         General transfer comment: Sit to stand 2 trials. Unable to stand without elevated surface and RW for support on initial trial. Did well on second trial, required min assist for powering up and stability. VC for hand placement, sequencing, and avoiding forward bend.   Ambulation/Gait Ambulation/Gait assistance: Min guard Ambulation Distance (Feet): 250 Feet Assistive  device: Rolling walker (2 wheeled) Gait Pattern/deviations: Step-through pattern;Decreased step length - right;Decreased step length - left   Gait velocity interpretation: Below normal speed for age/gender General Gait Details: Pt required VC to keep head up. Min guard for stability  Stairs Stairs: Yes Stairs assistance: Min guard Stair Management: Step to pattern;Two rails;Forwards Number of Stairs: 6 (3 steps x2) General stair comments: Min guard for stability. VC for sequencing and maintaining precautions. Pt had minimal L LE step clearance when descending stairs.  Wheelchair Mobility    Modified Rankin (Stroke Patients Only)       Balance Overall balance assessment: Needs assistance Sitting-balance support: Feet supported Sitting balance-Leahy Scale: Fair     Standing balance support: No upper extremity supported Standing balance-Leahy Scale: Fair Standing balance comment: Was able to ambulate stairs and stand without RW. Required RW for stability while walking                             Pertinent Vitals/Pain Pain Assessment: No/denies pain    Home Living Family/patient expects to be discharged to:: Private residence Living Arrangements: Spouse/significant other Available Help at Discharge: Family Type of Home: House       Home Layout: Multi-level Home Equipment: Dan HumphreysWalker - 2 wheels;Crutches;Bedside commode      Prior Function Level of Independence: Independent               Hand Dominance   Dominant Hand: Right    Extremity/Trunk Assessment   Upper Extremity Assessment: Defer to OT evaluation           Lower Extremity Assessment: Generalized weakness (Reported R LE was stronger  than L LE)      Cervical / Trunk Assessment: Normal  Communication   Communication: No difficulties  Cognition Arousal/Alertness: Awake/alert Behavior During Therapy: WFL for tasks assessed/performed Overall Cognitive Status: Within Functional Limits  for tasks assessed                      General Comments      Exercises        Assessment/Plan    PT Assessment Patient needs continued PT services  PT Diagnosis Difficulty walking;Generalized weakness   PT Problem List Decreased strength;Decreased range of motion;Decreased activity tolerance;Decreased balance;Decreased mobility;Decreased knowledge of precautions;Decreased knowledge of use of DME  PT Treatment Interventions DME instruction;Gait training;Stair training;Functional mobility training;Therapeutic activities;Therapeutic exercise;Patient/family education;Balance training   PT Goals (Current goals can be found in the Care Plan section) Acute Rehab PT Goals Patient Stated Goal: Return home PT Goal Formulation: With patient/family Time For Goal Achievement: 03/05/15 Potential to Achieve Goals: Good    Frequency Min 5X/week   Barriers to discharge        Co-evaluation               End of Session Equipment Utilized During Treatment: Gait belt Activity Tolerance: Patient tolerated treatment well Patient left: in chair;with call bell/phone within reach;with family/visitor present           Time: 1000-1030 PT Time Calculation (min) (ACUTE ONLY): 30 min   Charges:   PT Evaluation $Initial PT Evaluation Tier I: 1 Procedure PT Treatments $Gait Training: 8-22 mins   PT G CodesJimmy [name] Mar 17, 2015, 1:19 PM Angel [name], SPT 03-17-15 1:19 PM

## 2015-03-21 NOTE — Progress Notes (Signed)
Pt's spouse called for assistance to pt's room [id number].  Upon arrival to pt's room, pt sitting on side of bed gasping for air. Pulse present and strong, respirations shallow but present. Pt not speaking, but responds to name being called. Called charge nurse and rapid response. Pt motions that he is having difficulty breathing and nasal cannula oxygen placed. Rapid response nurse and charge nurse arrive and Code Blue initiated. MD notified. Family present and notified when pt expired. See code notes and MD note for more details. Lawson RadarHeather M Ivylynn Hoppes

## 2015-03-21 NOTE — Progress Notes (Signed)
   02/28/2015 2000  Clinical Encounter Type  Visited With Family  Visit Type Code  Referral From Nurse  Spiritual Encounters  Spiritual Needs Grief support;Emotional;Prayer  Stress Factors  Family Stress Factors Loss;Lack of knowledge  Chaplain responded to Code Blue, escorted wife and friend to small waiting room and kept staff and family in contact. Called family pastor for wife. Prayed with family before and after patient's death. Stayed with them until pastor arrived.

## 2015-03-21 NOTE — Discharge Summary (Signed)
Physician Discharge Summary  Patient ID: Angel [name] MRN: [id number] DOB/AGE: [birth year] 60 y.o.  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  He did well post-operatively, with improved bilateral lower extremity strength and was upa nd ambulating on POD 1.  The patient later became SOB, code was called and he had sudden cardiac arrest.  Despite coding for greater than 30 minutes, including use of thrombolytics for presumed PE, the patient expired without improvement from Asystole.  Consults: None  Significant Diagnostic Studies: None  Treatments: surgery: decompressive laminectomy L 2 - L 3 levels with removal of traumatic disc herniation  Discharge Exam: Blood pressure 106/57, pulse 65, temperature 97.8 F (36.6 C), temperature source Oral, resp. rate 20, height 5\' 9"  (1.753 m), weight 102.967 kg (227 lb), SpO2 96 %. Deceased  Disposition: Expired   Signed: Dorian HeckleSTERN,Brenleigh Collet D, MD 03/02/2015, 7:33 AM

## 2015-03-21 NOTE — Significant Event (Signed)
Rapid Response Event Note  Overview:  Called to see patient who RN reports not responding Time Called: 1744 Arrival Time: 1748 Event Type: Neurologic  Initial Focused Assessment:  On arrival patient sitting on edge of bed - one foot on the floor - other in bed  - supporting own weight- turns to name calling - restless - does not answer but brings hand to chest - air Guineahungary - skin is warm and dry - wife present reports he got up to go the the bathroom and on return began to be restless and not answering her - during my assessment patient quickly becomes totally unresponsive - apneic and pulseless - no monitors on - CODE BLUE initiated - chest compressions started - code cart to room - first noted pulse was PEA - CPR continued - Epinephrine IV given - Code team arrives and Resident MD takes over as team leader.  See CODE BLUE sheet for details.    Interventions:   Event Summary: Name of Physician Notified: Dr. Jordan LikesPool at 1750    at    Outcome: Coded and expired  Event End Time: 1823  Delton PrairieBritt, Anely Spiewak L

## 2015-03-21 NOTE — Procedures (Signed)
Intubation Procedure Note Angel [name] [id number] 02-26-1956  Procedure: Intubation Indications: Respiratory insufficiency  Procedure Details Consent: Risks of procedure as well as the alternatives and risks of each were explained to the (patient/caregiver).  Consent for procedure obtained. and Unable to obtain consent because of emergent medical necessity. Time Out: Verified patient identification, verified procedure, site/side was marked, verified correct patient position, special equipment/implants available, medications/allergies/relevent history reviewed, required imaging and test results available.  Performed  Maximum sterile technique was used including cap, gloves, gown and mask.  MAC and 3    Evaluation Hemodynamic Status: Transient hypotension treated with fluid; O2 sats: currently acceptable Patient's Current Condition: unstable Complications: No apparent complications Patient did tolerate procedure well. Chest X-ray ordered to verify placement.  CXR: tube position acceptable.  Code Blue  Leafy HalfSnider, Yareth Macdonnell Dale 02/18/2015

## 2015-03-21 DEATH — deceased

## 2017-02-26 IMAGING — CR DG LUMBAR SPINE COMPLETE 4+V
5 series · 5 of 5 positions shown · non-contrast
Comparison: None.

CLINICAL DATA: Back injury.

EXAM:
LUMBAR SPINE - COMPLETE 4+ VIEW

[t l-spine a.p.]
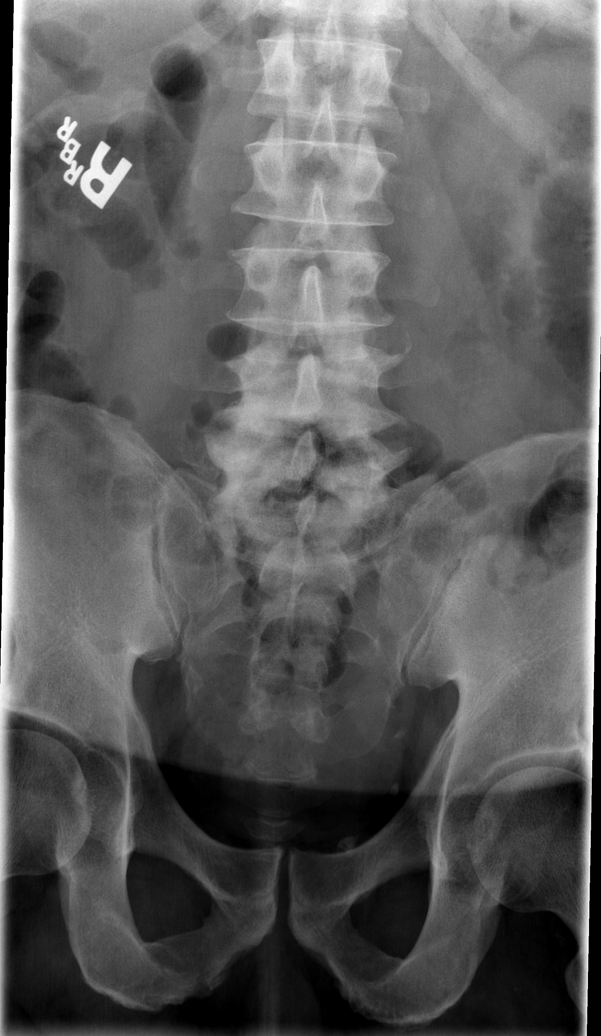

[t l-spine oblique exposure (1 of 2)]
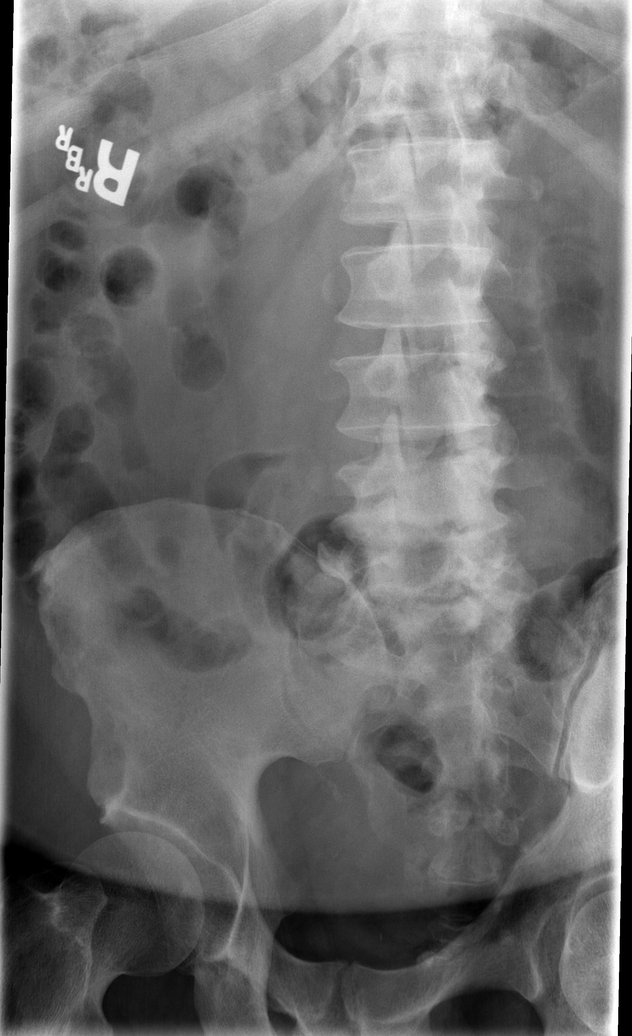

[t l-spine oblique exposure (2 of 2)]
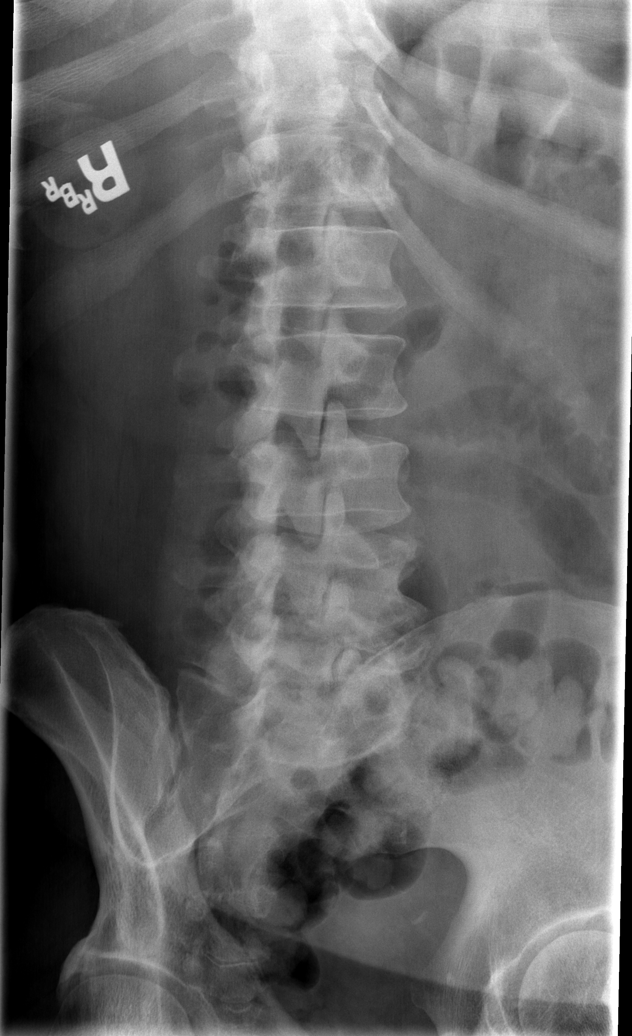

[t l-spine lat]
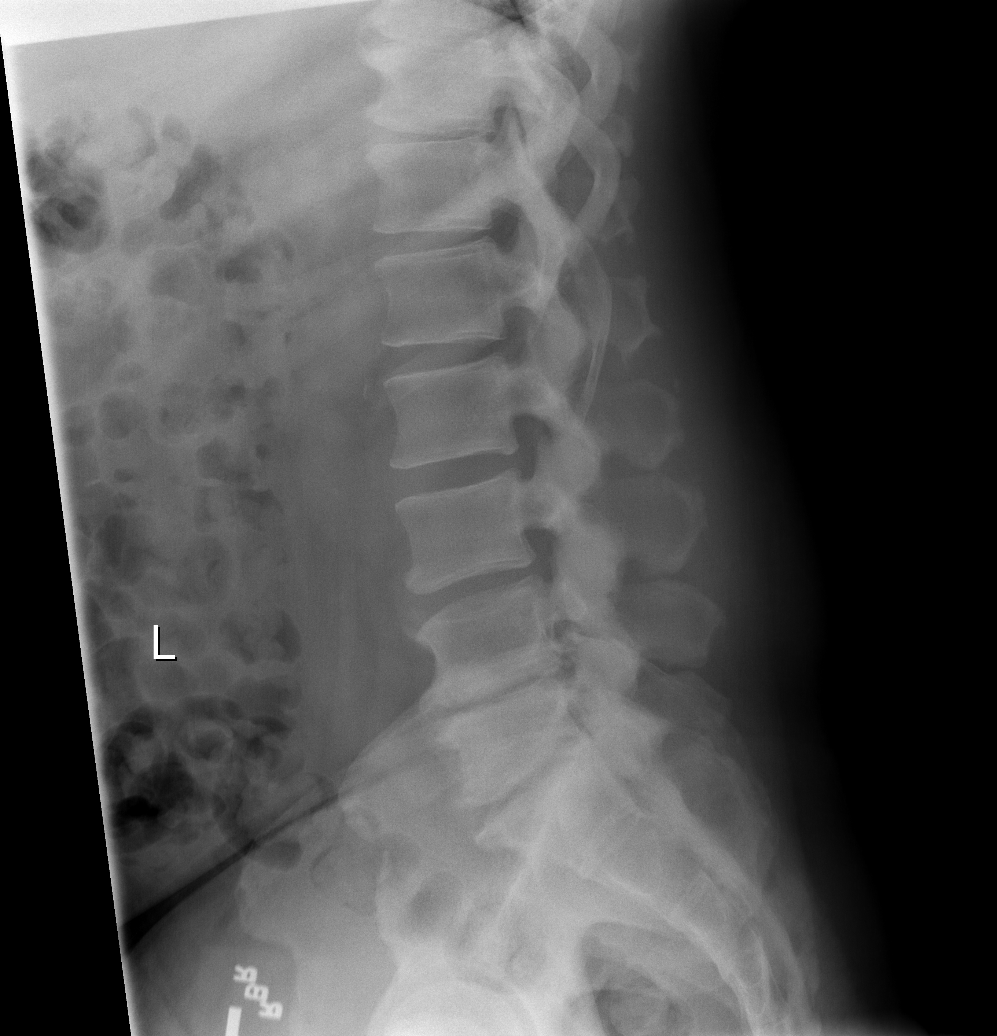

[t l-spine l5-s1 spot]
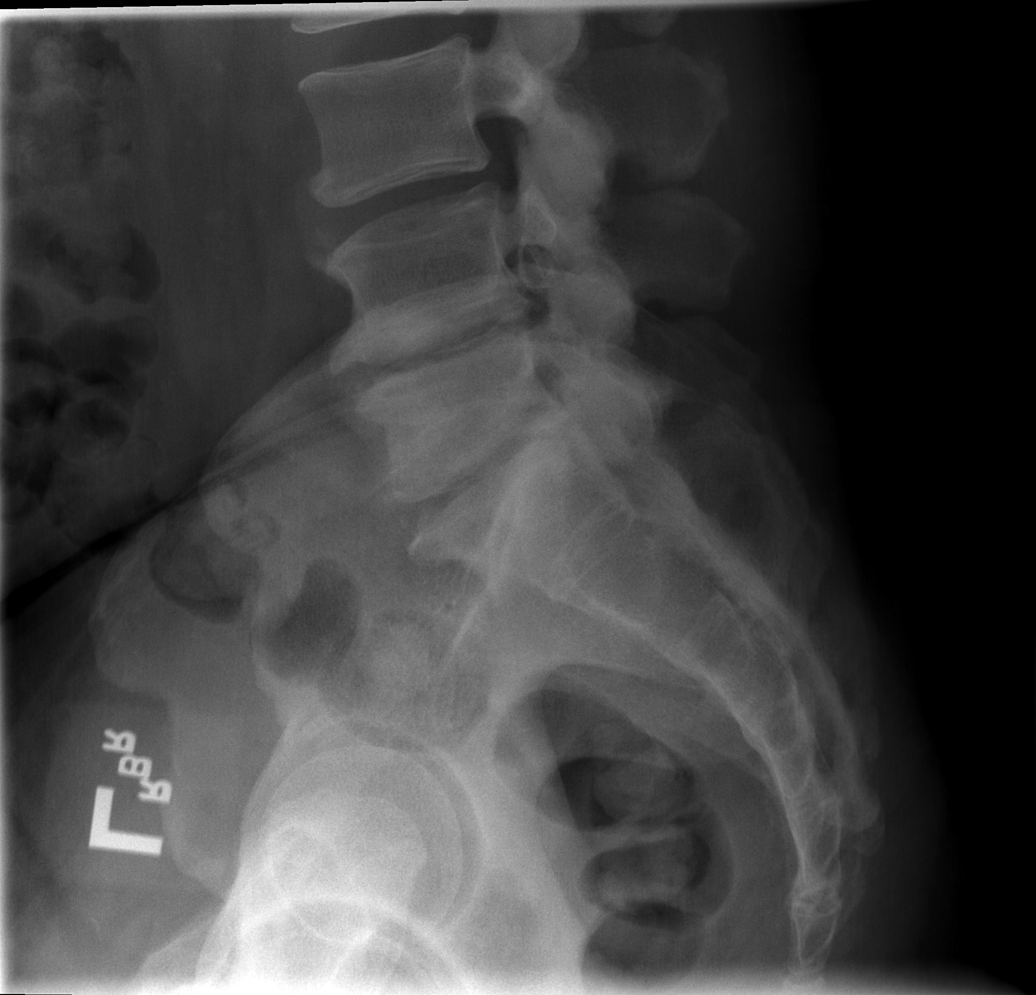

[5 of 5 positions shown; findings below may reference images not displayed]

FINDINGS: There are moderate degenerative changes in the lower lumbar spine,
with disc height loss and osteophytes at L4-5 and L5-S1. No
spondylolisthesis or spondylolysis.
IMPRESSION: 1. Degenerative changes in the lower lumbar spine.
2.  No evidence for acute  abnormality.
